# Patient Record
Sex: Male | Born: 1960 | Race: Black or African American | Hispanic: No | Marital: Single | Smoking: Never smoker
Health system: Southern US, Community
[De-identification: ages and names within clinical notes are randomized; demographics above are authoritative.]

## PROBLEM LIST (undated history)

## (undated) DIAGNOSIS — E119 Type 2 diabetes mellitus without complications: Secondary | ICD-10-CM

## (undated) DIAGNOSIS — D890 Polyclonal hypergammaglobulinemia: Secondary | ICD-10-CM

## (undated) DIAGNOSIS — I10 Essential (primary) hypertension: Secondary | ICD-10-CM

## (undated) DIAGNOSIS — F1010 Alcohol abuse, uncomplicated: Secondary | ICD-10-CM

## (undated) HISTORY — PX: OTHER SURGICAL HISTORY: SHX169

---

## 2003-04-01 ENCOUNTER — Encounter: Admission: RE | Admit: 2003-04-01 | Discharge: 2003-04-01 | Payer: Self-pay | Admitting: Neurology

## 2003-04-01 ENCOUNTER — Encounter: Payer: Self-pay | Admitting: Neurology

## 2004-01-25 ENCOUNTER — Emergency Department (HOSPITAL_COMMUNITY): Admission: EM | Admit: 2004-01-25 | Discharge: 2004-01-25 | Payer: Self-pay | Admitting: *Deleted

## 2004-08-27 ENCOUNTER — Emergency Department (HOSPITAL_COMMUNITY): Admission: EM | Admit: 2004-08-27 | Discharge: 2004-08-27 | Payer: Self-pay | Admitting: *Deleted

## 2004-09-04 ENCOUNTER — Emergency Department (HOSPITAL_COMMUNITY): Admission: EM | Admit: 2004-09-04 | Discharge: 2004-09-04 | Payer: Self-pay | Admitting: Emergency Medicine

## 2012-02-11 ENCOUNTER — Encounter (HOSPITAL_COMMUNITY): Payer: Self-pay | Admitting: Emergency Medicine

## 2012-02-11 ENCOUNTER — Emergency Department (HOSPITAL_COMMUNITY)
Admission: EM | Admit: 2012-02-11 | Discharge: 2012-02-11 | Disposition: A | Discharge status: Home / Self Care | Payer: BC Managed Care – PPO | Attending: Emergency Medicine | Admitting: Emergency Medicine

## 2012-02-11 DIAGNOSIS — M109 Gout, unspecified: Secondary | ICD-10-CM | POA: Insufficient documentation

## 2012-02-11 DIAGNOSIS — M25579 Pain in unspecified ankle and joints of unspecified foot: Secondary | ICD-10-CM | POA: Insufficient documentation

## 2012-02-11 DIAGNOSIS — I10 Essential (primary) hypertension: Secondary | ICD-10-CM | POA: Insufficient documentation

## 2012-02-11 HISTORY — DX: Essential (primary) hypertension: I10

## 2012-02-11 MED ORDER — INDOMETHACIN 50 MG PO CAPS: 50.0000 mg | ORAL_CAPSULE | Freq: Three times a day (TID) | ORAL | Status: AC

## 2012-02-11 NOTE — Discharge Instructions (Signed)
Arthritis - Gout Gout is caused by uric acid crystals forming in the joint. The big toe, foot, ankle, and knee are the joints most often affected.Often uric acid levels in the blood are also elevated. Symptoms develop rapidly, usually over hours. A joint fluid exam may be needed to prove the diagnosis. Acute gout episodes may follow a minor injury, surgery, illness or medication change. Gout occurs more often in men. It tends to be an inherited (passed down from a family member) condition. Your chance of having gout is increased if you take certain medications. These include water pills (diuretics), aspirin, niacin, and cyclosporine. Kidney disease and alcohol consumption may also increase your chances of getting gout. Months or years can pass between gout attacks.  Treatment includes ice, rest and raising the affected limb until the swelling and pain are better.Anti-inflammatory medicine usually brings about dramatic relief of pain, redness and swelling within 2 to 3 days. Other medications can also be effective. Increase your fluid intake. Do not drink alcohol or eat liver, sweetbreads, or sardines. Long-term gout management may require medicine to lower blood uric acid levels, or changing or stopping diuretics. Please see your caregiver if your condition is not better after 1 to 2 days of treatment.  SEEK IMMEDIATE MEDICAL CARE IF:  You have more serious symptoms such as a fever, skin rash, diarrhea, vomiting, or other joint pains. Document Released: 12/15/2004 Document Revised: 10/27/2011 Document Reviewed: 12/29/2008 [employer] Patient Information 2012 [employer]. 

## 2012-02-11 NOTE — ED Notes (Signed)
Pt c/o rt ankle pain since the 21st and has been out of work for the same and needs a work note.

## 2012-02-11 NOTE — ED Notes (Signed)
Pt refers to his pain as a questionable gout flare-up. Pt denies injury at this time.

## 2012-02-14 NOTE — ED Provider Notes (Signed)
History     CSN: [id number]  Arrival date & time 02/11/12  1144   First MD Initiated Contact with Patient 02/11/12 1210      Chief Complaint  Patient presents with  . Ankle Pain    (Consider location/radiation/quality/duration/timing/severity/associated sxs/prior treatment) Patient is a 51 y.o. male presenting with ankle pain. The history is provided by the patient. No language interpreter was used.  Ankle Pain  The incident occurred 2 days ago. The incident occurred at home. There was no injury mechanism. The pain is present in the left ankle. The quality of the pain is described as aching and burning. The pain is moderate. The pain has been constant since onset. Pertinent negatives include no numbness, no inability to bear weight, no loss of motion, no muscle weakness, no loss of sensation and no tingling. He reports no foreign bodies present. The symptoms are aggravated by activity, bearing weight and palpation. He has tried rest for the symptoms. The treatment provided no relief.    Past Medical History  Diagnosis Date  . Hypertension   . Gout     History reviewed. No pertinent past surgical history.  History reviewed. No pertinent family history.  History  Substance Use Topics  . Smoking status: Not on file  . Smokeless tobacco: Not on file  . Alcohol Use: Yes      Review of Systems  Musculoskeletal: Positive for arthralgias. Negative for joint swelling.  Skin: Negative for rash and wound.  Neurological: Negative for tingling and numbness.  Hematological: Does not bruise/bleed easily.  All other systems reviewed and are negative.    Allergies  Review of patient's allergies indicates no known allergies.  Home Medications   Current Outpatient Rx  Name Route Sig Dispense Refill  . INDOMETHACIN 50 MG PO CAPS Oral Take 1 capsule (50 mg total) by mouth 3 (three) times daily with meals. Until pain is improved 21 capsule 0    BP 188/105  Temp(Src) 97.9 F (36.6  C) (Oral)  Resp 20  Ht 5' 9.5" (1.765 m)  Wt 210 lb (95.255 kg)  BMI 30.57 kg/m2  SpO2 100%  Physical Exam  Nursing note and vitals reviewed. Constitutional: He is oriented to person, place, and time. He appears well-developed and well-nourished. No distress.  HENT:  Head: Normocephalic and atraumatic.  Neck: Neck supple.  Cardiovascular: Normal rate, regular rhythm and normal heart sounds.   Pulmonary/Chest: Effort normal and breath sounds normal. No respiratory distress.  Musculoskeletal: Normal range of motion. He exhibits tenderness. He exhibits no edema.       Right ankle: He exhibits normal range of motion, no swelling, no ecchymosis, no deformity, no laceration and normal pulse. tenderness. Lateral malleolus and medial malleolus tenderness found. No head of 5th metatarsal and no proximal fibula tenderness found. Achilles tendon normal.       Feet:  Neurological: He is alert and oriented to person, place, and time. No cranial nerve deficit. He exhibits normal muscle tone. Coordination normal.  Skin: Skin is warm and dry.    ED Course  Procedures (including critical care time)    1. Ankle pain   2. Gout       MDM     Patient with ttp of the right ankle and has hx of gout.  States pain feels similar to previous gout attacks.  DP puls is brisk.  Sensation intact, no open wounds to the ankle or foot.  No excessive erythema.  I doubt cellulitis.  No  calf pain or swelling.  I will treat with indocin .  He agrees to return here if the sx's worsen.  I will also refer to ortho.  Pt also requests a note for work.  Patient / Family / Caregiver understand and agree with initial ED impression and plan with expectations set for ED visit. Pt stable in ED with no significant deterioration in condition. Pt feels improved after observation and/or treatment in ED.      Strummer Canipe L. [place], [place] 02/14/12 2136

## 2012-02-15 NOTE — ED Provider Notes (Signed)
Medical screening examination/treatment/procedure(s) were performed by non-physician practitioner and as supervising physician I was immediately available for consultation/collaboration.   Aleyssa Pike W Auset Fritzler, MD 02/15/12 0729 

## 2017-07-16 ENCOUNTER — Emergency Department (HOSPITAL_COMMUNITY): Payer: No Typology Code available for payment source

## 2017-07-16 ENCOUNTER — Encounter (HOSPITAL_COMMUNITY): Payer: Self-pay

## 2017-07-16 ENCOUNTER — Inpatient Hospital Stay (HOSPITAL_COMMUNITY)
Admission: EM | Admit: 2017-07-16 | Discharge: 2017-08-04 | DRG: 432 | Disposition: A | Discharge status: Hospice | Payer: No Typology Code available for payment source | Attending: Internal Medicine | Admitting: Internal Medicine

## 2017-07-16 DIAGNOSIS — J90 Pleural effusion, not elsewhere classified: Secondary | ICD-10-CM | POA: Diagnosis not present

## 2017-07-16 DIAGNOSIS — D631 Anemia in chronic kidney disease: Secondary | ICD-10-CM | POA: Diagnosis present

## 2017-07-16 DIAGNOSIS — Z6828 Body mass index (BMI) 28.0-28.9, adult: Secondary | ICD-10-CM

## 2017-07-16 DIAGNOSIS — R188 Other ascites: Secondary | ICD-10-CM

## 2017-07-16 DIAGNOSIS — E8809 Other disorders of plasma-protein metabolism, not elsewhere classified: Secondary | ICD-10-CM | POA: Diagnosis present

## 2017-07-16 DIAGNOSIS — K7031 Alcoholic cirrhosis of liver with ascites: Principal | ICD-10-CM | POA: Diagnosis present

## 2017-07-16 DIAGNOSIS — Z515 Encounter for palliative care: Secondary | ICD-10-CM

## 2017-07-16 DIAGNOSIS — Z7189 Other specified counseling: Secondary | ICD-10-CM | POA: Diagnosis not present

## 2017-07-16 DIAGNOSIS — E43 Unspecified severe protein-calorie malnutrition: Secondary | ICD-10-CM | POA: Diagnosis not present

## 2017-07-16 DIAGNOSIS — K5641 Fecal impaction: Secondary | ICD-10-CM | POA: Diagnosis not present

## 2017-07-16 DIAGNOSIS — E875 Hyperkalemia: Secondary | ICD-10-CM | POA: Diagnosis not present

## 2017-07-16 DIAGNOSIS — M109 Gout, unspecified: Secondary | ICD-10-CM | POA: Diagnosis present

## 2017-07-16 DIAGNOSIS — N170 Acute kidney failure with tubular necrosis: Secondary | ICD-10-CM | POA: Diagnosis present

## 2017-07-16 DIAGNOSIS — E1122 Type 2 diabetes mellitus with diabetic chronic kidney disease: Secondary | ICD-10-CM | POA: Diagnosis present

## 2017-07-16 DIAGNOSIS — I129 Hypertensive chronic kidney disease with stage 1 through stage 4 chronic kidney disease, or unspecified chronic kidney disease: Secondary | ICD-10-CM | POA: Diagnosis present

## 2017-07-16 DIAGNOSIS — R140 Abdominal distension (gaseous): Secondary | ICD-10-CM | POA: Diagnosis not present

## 2017-07-16 DIAGNOSIS — R64 Cachexia: Secondary | ICD-10-CM | POA: Diagnosis present

## 2017-07-16 DIAGNOSIS — N179 Acute kidney failure, unspecified: Secondary | ICD-10-CM | POA: Diagnosis not present

## 2017-07-16 DIAGNOSIS — D638 Anemia in other chronic diseases classified elsewhere: Secondary | ICD-10-CM | POA: Diagnosis present

## 2017-07-16 DIAGNOSIS — F1011 Alcohol abuse, in remission: Secondary | ICD-10-CM | POA: Diagnosis present

## 2017-07-16 DIAGNOSIS — K652 Spontaneous bacterial peritonitis: Secondary | ICD-10-CM | POA: Diagnosis present

## 2017-07-16 DIAGNOSIS — N189 Chronic kidney disease, unspecified: Secondary | ICD-10-CM | POA: Diagnosis present

## 2017-07-16 DIAGNOSIS — R296 Repeated falls: Secondary | ICD-10-CM | POA: Diagnosis present

## 2017-07-16 DIAGNOSIS — Z9181 History of falling: Secondary | ICD-10-CM

## 2017-07-16 DIAGNOSIS — D696 Thrombocytopenia, unspecified: Secondary | ICD-10-CM | POA: Diagnosis present

## 2017-07-16 DIAGNOSIS — R0602 Shortness of breath: Secondary | ICD-10-CM

## 2017-07-16 DIAGNOSIS — K429 Umbilical hernia without obstruction or gangrene: Secondary | ICD-10-CM | POA: Diagnosis present

## 2017-07-16 DIAGNOSIS — K7460 Unspecified cirrhosis of liver: Secondary | ICD-10-CM | POA: Diagnosis not present

## 2017-07-16 DIAGNOSIS — E872 Acidosis: Secondary | ICD-10-CM | POA: Diagnosis present

## 2017-07-16 DIAGNOSIS — E871 Hypo-osmolality and hyponatremia: Secondary | ICD-10-CM | POA: Diagnosis present

## 2017-07-16 DIAGNOSIS — Z66 Do not resuscitate: Secondary | ICD-10-CM | POA: Diagnosis present

## 2017-07-16 DIAGNOSIS — F17290 Nicotine dependence, other tobacco product, uncomplicated: Secondary | ICD-10-CM | POA: Diagnosis present

## 2017-07-16 DIAGNOSIS — E8770 Fluid overload, unspecified: Secondary | ICD-10-CM | POA: Diagnosis present

## 2017-07-16 DIAGNOSIS — Z87898 Personal history of other specified conditions: Secondary | ICD-10-CM | POA: Diagnosis not present

## 2017-07-16 DIAGNOSIS — D890 Polyclonal hypergammaglobulinemia: Secondary | ICD-10-CM | POA: Diagnosis present

## 2017-07-16 HISTORY — DX: Polyclonal hypergammaglobulinemia: D89.0

## 2017-07-16 HISTORY — DX: Alcohol abuse, uncomplicated: F10.10

## 2017-07-16 HISTORY — DX: Type 2 diabetes mellitus without complications: E11.9

## 2017-07-16 LAB — COMPREHENSIVE METABOLIC PANEL
ALT: 11 U/L — ABNORMAL LOW (ref 17–63)
AST: 29 U/L (ref 15–41)
Albumin: 1.8 g/dL — ABNORMAL LOW (ref 3.5–5.0)
Alkaline Phosphatase: 51 U/L (ref 38–126)
Anion gap: 10 (ref 5–15)
BUN: 30 mg/dL — ABNORMAL HIGH (ref 6–20)
CO2: 18 mmol/L — ABNORMAL LOW (ref 22–32)
Calcium: 8.4 mg/dL — ABNORMAL LOW (ref 8.9–10.3)
Chloride: 101 mmol/L (ref 101–111)
Creatinine, Ser: 2.39 mg/dL — ABNORMAL HIGH (ref 0.61–1.24)
GFR calc Af Amer: 33 mL/min — ABNORMAL LOW (ref >60)
GFR calc non Af Amer: 29 mL/min — ABNORMAL LOW (ref >60)
Glucose, Bld: 136 mg/dL — ABNORMAL HIGH (ref 65–99)
Potassium: 4.2 mmol/L (ref 3.5–5.1)
Sodium: 129 mmol/L — ABNORMAL LOW (ref 135–145)
Total Bilirubin: 2.9 mg/dL — ABNORMAL HIGH (ref 0.3–1.2)
Total Protein: 7.2 g/dL (ref 6.5–8.1)

## 2017-07-16 LAB — LACTIC ACID, PLASMA
Lactic Acid, Venous: 3.4 mmol/L (ref 0.5–1.9)
Lactic Acid, Venous: 3.3 mmol/L (ref 0.5–1.9)

## 2017-07-16 LAB — CBC WITH DIFFERENTIAL/PLATELET
Basophils Absolute: 0 10*3/uL (ref 0.0–0.1)
Basophils Relative: 0 %
Eosinophils Absolute: 0 10*3/uL (ref 0.0–0.7)
Eosinophils Relative: 0 %
HCT: 29.4 % — ABNORMAL LOW (ref 39.0–52.0)
Hemoglobin: 10.2 g/dL — ABNORMAL LOW (ref 13.0–17.0)
Lymphocytes Relative: 9 %
Lymphs Abs: 0.7 10*3/uL (ref 0.7–4.0)
MCH: 34.1 pg — ABNORMAL HIGH (ref 26.0–34.0)
MCHC: 34.7 g/dL (ref 30.0–36.0)
MCV: 98.3 fL (ref 78.0–100.0)
Monocytes Absolute: 1 10*3/uL (ref 0.1–1.0)
Monocytes Relative: 12 %
Neutro Abs: 6.5 10*3/uL (ref 1.7–7.7)
Neutrophils Relative %: 78 %
Platelets: 103 10*3/uL — ABNORMAL LOW (ref 150–400)
RBC: 2.99 MIL/uL — ABNORMAL LOW (ref 4.22–5.81)
RDW: 15.1 % (ref 11.5–15.5)
WBC: 8.3 10*3/uL (ref 4.0–10.5)

## 2017-07-16 LAB — ETHANOL: Alcohol, Ethyl (B): <5 mg/dL (ref <5)

## 2017-07-16 LAB — TROPONIN I: Troponin I: <0.03 ng/mL (ref <0.03)

## 2017-07-16 LAB — PROTIME-INR
INR: 1.97
Prothrombin Time: 22.8 seconds — ABNORMAL HIGH (ref 11.4–15.2)

## 2017-07-16 LAB — BRAIN NATRIURETIC PEPTIDE: B Natriuretic Peptide: 59 pg/mL (ref 0.0–100.0)

## 2017-07-16 MED ORDER — SODIUM CHLORIDE 0.9 % IV SOLN
INTRAVENOUS | Status: DC
  Administered 2017-07-16: via INTRAVENOUS

## 2017-07-16 MED ORDER — SODIUM CHLORIDE 0.9 % IV BOLUS (SEPSIS)
250.0000 mL | Freq: Once | INTRAVENOUS | Status: AC
  Administered 2017-07-16: 250 mL via INTRAVENOUS

## 2017-07-16 MED ORDER — ONDANSETRON HCL 4 MG PO TABS: 4.0000 mg | ORAL_TABLET | Freq: Four times a day (QID) | ORAL | Status: DC | PRN

## 2017-07-16 MED ORDER — ALBUMIN HUMAN 25 % IV SOLN
12.5000 g | Freq: Once | INTRAVENOUS | Status: AC
  Administered 2017-07-17: 12.5 g via INTRAVENOUS
  Filled 2017-07-16: qty 50

## 2017-07-16 MED ORDER — SODIUM CHLORIDE 0.9 % IV BOLUS (SEPSIS): 1000.0000 mL | Freq: Once | INTRAVENOUS | Status: DC

## 2017-07-16 MED ORDER — DEXTROSE 5 % IV SOLN
2.0000 g | INTRAVENOUS | Status: DC
  Administered 2017-07-16 – 2017-07-23 (×8): 2 g via INTRAVENOUS
  Filled 2017-07-16 (×10): qty 2

## 2017-07-16 MED ORDER — INSULIN ASPART 100 UNIT/ML ~~LOC~~ SOLN
0.0000 [IU] | Freq: Three times a day (TID) | SUBCUTANEOUS | Status: DC
  Administered 2017-07-18 – 2017-07-22 (×7): 1 [IU] via SUBCUTANEOUS
  Administered 2017-07-22: 2 [IU] via SUBCUTANEOUS
  Administered 2017-07-23: 0 [IU] via SUBCUTANEOUS
  Administered 2017-07-23: 2 [IU] via SUBCUTANEOUS
  Administered 2017-07-24: 1 [IU] via SUBCUTANEOUS
  Administered 2017-07-24: 2 [IU] via SUBCUTANEOUS
  Administered 2017-07-25 (×3): 1 [IU] via SUBCUTANEOUS
  Administered 2017-07-26: 2 [IU] via SUBCUTANEOUS
  Administered 2017-07-27 – 2017-07-30 (×3): 1 [IU] via SUBCUTANEOUS
  Administered 2017-08-01: 2 [IU] via SUBCUTANEOUS
  Administered 2017-08-01: 1 [IU] via SUBCUTANEOUS
  Administered 2017-08-02: 2 [IU] via SUBCUTANEOUS
  Administered 2017-08-02: 1 [IU] via SUBCUTANEOUS
  Administered 2017-08-02 – 2017-08-03 (×2): 2 [IU] via SUBCUTANEOUS

## 2017-07-16 MED ORDER — SODIUM CHLORIDE 0.9 % IV SOLN
INTRAVENOUS | Status: DC
  Administered 2017-07-16: 17:00:00 via INTRAVENOUS

## 2017-07-16 MED ORDER — SODIUM CHLORIDE 0.9 % IV BOLUS (SEPSIS)
500.0000 mL | Freq: Once | INTRAVENOUS | Status: AC
  Administered 2017-07-16: 500 mL via INTRAVENOUS

## 2017-07-16 MED ORDER — ONDANSETRON HCL 4 MG/2ML IJ SOLN: 4.0000 mg | Freq: Four times a day (QID) | INTRAMUSCULAR | Status: DC | PRN

## 2017-07-16 NOTE — ED Notes (Signed)
CRITICAL VALUE ALERT  Critical Value:  Lactic Acid 3.4  Date & Time Notied:  07/16/17 1704  Provider Notified: B. Garner Nash, RN  Orders Received/Actions taken: notified Dr. Clarene Duke

## 2017-07-16 NOTE — Progress Notes (Signed)
ANTIBIOTIC CONSULT NOTE-Preliminary  Pharmacy Consult for Ceftriaxone Indication: Spontaneous bacterial peritonitis  No Known Allergies  Patient Measurements: Height: 5\' 9"  (175.3 cm) Weight: 216 lb 8 oz (98.2 kg) IBW/kg (Calculated) : 70.7   Vital Signs: Temp: 97.5 F (36.4 C) (08/26 2112) Temp Source: Oral (08/26 2112) BP: 102/72 (08/26 2112) Pulse Rate: 103 (08/26 2112)  Labs:  Recent Labs  07/16/17 1555  WBC 8.3  HGB 10.2*  PLT 103*  CREATININE 2.39*    Estimated Creatinine Clearance: 39.9 mL/min (A) (by C-G formula based on SCr of 2.39 mg/dL (H)).  No results for input(s): VANCOTROUGH, VANCOPEAK, VANCORANDOM, GENTTROUGH, GENTPEAK, GENTRANDOM, TOBRATROUGH, TOBRAPEAK, TOBRARND, AMIKACINPEAK, AMIKACINTROU, AMIKACIN in the last 72 hours.   Microbiology: No results found for this or any previous visit (from the past 720 hour(s)).  Medical History: Past Medical History:  Diagnosis Date  . Alcohol abuse   . Diabetes mellitus without complication (HCC)   . Gout   . Hypertension     Medications:   Assessment: 56 yo male with hx of alcohol abuse seen in the ED for complaints of weakness and abdominal distension. CT showed cirrhosis and large abdominopelvic ascites. Pt also has pleural effusions. Pneumonia not excluded. Pharmacy has been consulted for ceftriaxone dosing.  Goal of Therapy:  Eradicated infection  Plan:  Preliminary review of pertinent patient information completed.  Protocol will be initiated with one dose of ceftriaxone 2 Gm IV.  Jeani Hawking clinical pharmacist will complete review during morning rounds to assess patient and finalize treatment regimen if needed.  Arelia Sneddon, [facility] 07/16/2017,10:47 PM

## 2017-07-16 NOTE — ED Provider Notes (Signed)
[facility]-EMERGENCY DEPT Provider Note   CSN: [id number] Arrival date & time: 07/16/17  1520     History   Chief Complaint Chief Complaint  Patient presents with  . abdominal distension  . Weakness    HPI Shawn [name] is a 56 y.o. male.  HPI  Pt was seen at 1540. Per pt and his friend: c/o gradual onset and worsening of persistent generalized weakness and abd distention for the past 8 months. Has been associated with bilat LE's edema. Pt endorses hx of heavy etoh use, but states he "quit" 4 months ago. Pt's friend states they finally convinced him to come to the hospital today because he "has been falling a lot," "not taking care of his diabetes," "not eating,' and "has no strength." Pt lives alone. Denies CP/palpitations, no SOB/cough, no abd pain, no N/V/D, no fevers, no focal motor weakness, no tingling/numbness in extremities.     Past Medical History:  Diagnosis Date  . Alcohol abuse   . Diabetes mellitus without complication (HCC)   . Gout   . Hypertension     There are no active problems to display for this patient.   History reviewed. No pertinent surgical history.     Home Medications    Prior to Admission medications   Not on File    Family History No family history on file.  Social History Social History  Substance Use Topics  . Smoking status: Never Smoker  . Smokeless tobacco: Current User  . Alcohol use Yes     Comment: stopped drinking 3 months ago, but normally 6 pack per pay     Allergies   Patient has no known allergies.   Review of Systems Review of Systems ROS: Statement: All systems negative except as marked or noted in the HPI; Constitutional: Negative for fever and chills. +generalized weakness.; ; Eyes: Negative for eye pain, redness and discharge. ; ; ENMT: Negative for ear pain, hoarseness, nasal congestion, sinus pressure and sore throat. ; ; Cardiovascular: Negative for chest pain, palpitations, diaphoresis, dyspnea and  +peripheral edema. ; ; Respiratory: Negative for cough, wheezing and stridor. ; ; Gastrointestinal: +abd distention, poor PO intake. Negative for nausea, vomiting, diarrhea, abdominal pain, blood in stool, hematemesis, jaundice and rectal bleeding. . ; ; Genitourinary: Negative for dysuria, flank pain and hematuria. ; ; Musculoskeletal: Negative for back pain and neck pain. Negative for swelling and trauma.; ; Skin: Negative for pruritus, rash, abrasions, blisters, bruising and skin lesion.; ; Neuro: Negative for headache, lightheadedness and neck stiffness. Negative for altered level of consciousness, altered mental status, extremity weakness, paresthesias, involuntary movement, seizure and syncope.       Physical Exam Updated Vital Signs BP 105/77   Pulse (!) 106   Temp 98.7 F (37.1 C) (Oral)   Resp (!) 0   Ht 5\' 9"  (1.753 m)   SpO2 95%    Patient Vitals for the past 24 hrs:  BP Temp Temp src Pulse Resp SpO2 Height  07/16/17 1800 99/72 - - (!) 105 19 97 % -  07/16/17 1730 115/82 - - (!) 106 14 93 % -  07/16/17 1700 104/71 - - (!) 106 (!) 30 94 % -  07/16/17 1630 105/77 - - (!) 106 (!) 0 95 % -  07/16/17 1600 106/80 - - (!) 107 (!) 21 95 % -  07/16/17 1545 - - - (!) 110 - 98 % -  07/16/17 1538 114/81 98.7 F (37.1 C) Oral (!) 111 (!) 30  97 % -  07/16/17 1534 - - - - - - 5\' 9"  (1.753 m)      Physical Exam 1545: Physical examination:  Nursing notes reviewed; Vital signs and O2 SAT reviewed;  Constitutional: Well developed, Well nourished, Well hydrated, In no acute distress; Head:  Normocephalic, atraumatic; Eyes: EOMI, PERRL, No conjunctival injection; ENMT: Mouth and pharynx normal, Mucous membranes moist; Neck: Supple, Full range of motion, No lymphadenopathy; Cardiovascular: Tachycardic rate and rhythm, No gallop; Respiratory: Breath sounds clear & equal bilaterally, No wheezes. Speaking full sentences with ease, Normal respiratory effort/excursion; Chest: Nontender, Movement  normal; Abdomen:  Nontender, +softly distended, +fluid wave. Decreased bowel sounds; Genitourinary: No CVA tenderness; Extremities: Pulses normal, No tenderness, +3 pedal edema bilat feet to thighs.  No calf asymmetry.; Neuro: AA&Ox3, Major CN grossly intact.  Speech clear. No gross focal motor deficits in extremities.; Skin: Color sallow, Warm, Dry.   ED Treatments / Results  Labs (all labs ordered are listed, but only abnormal results are displayed)   EKG  EKG Interpretation  Date/Time:  Sunday July 16 2017 16:09:16 EDT Ventricular Rate:  106 PR Interval:    QRS Duration: 85 QT Interval:  318 QTC Calculation: 423 R Axis:   100 Text Interpretation:  Sinus tachycardia Anterior infarct, old Baseline wander No old tracing to compare Confirmed by Samuel Jester [phone]) on 07/16/2017 4:42:18 PM       Radiology   Procedures Procedures (including critical care time)  Medications Ordered in ED Medications - No data to display   Initial Impression / Assessment and Plan / ED Course  I have reviewed the triage vital signs and the nursing notes.  Pertinent labs & imaging results that were available during my care of the patient were reviewed by me and considered in my medical decision making (see chart for details).  MDM Reviewed: previous chart, nursing note and vitals Interpretation: labs, ECG, x-ray and CT scan Total time providing critical care: 30-74 minutes. This excludes time spent performing separately reportable procedures and services. Consults: admitting MD   CRITICAL CARE Performed by: Laray Anger Total critical care time: 35 minutes Critical care time was exclusive of separately billable procedures and treating other patients. Critical care was necessary to treat or prevent imminent or life-threatening deterioration. Critical care was time spent personally by me on the following activities: development of treatment plan with patient and/or surrogate as  well as nursing, discussions with consultants, evaluation of patient's response to treatment, examination of patient, obtaining history from patient or surrogate, ordering and performing treatments and interventions, ordering and review of laboratory studies, ordering and review of radiographic studies, pulse oximetry and re-evaluation of patient's condition.   Results for orders placed or performed during the hospital encounter of 07/16/17  Comprehensive metabolic panel  Result Value Ref Range   Sodium 129 (L) 135 - 145 mmol/L   Potassium 4.2 3.5 - 5.1 mmol/L   Chloride 101 101 - 111 mmol/L   CO2 18 (L) 22 - 32 mmol/L   Glucose, Bld 136 (H) 65 - 99 mg/dL   BUN 30 (H) 6 - 20 mg/dL   Creatinine, Ser 6.04 (H) 0.61 - 1.24 mg/dL   Calcium 8.4 (L) 8.9 - 10.3 mg/dL   Total Protein 7.2 6.5 - 8.1 g/dL   Albumin 1.8 (L) 3.5 - 5.0 g/dL   AST 29 15 - 41 U/L   ALT 11 (L) 17 - 63 U/L   Alkaline Phosphatase 51 38 - 126 U/L   Total  Bilirubin 2.9 (H) 0.3 - 1.2 mg/dL   GFR calc non Af Amer 29 (L) >60 mL/min   GFR calc Af Amer 33 (L) >60 mL/min   Anion gap 10 5 - 15  Ethanol  Result Value Ref Range   Alcohol, Ethyl (B) <5 <5 mg/dL  Troponin I  Result Value Ref Range   Troponin I <0.03 <0.03 ng/mL  Lactic acid, plasma  Result Value Ref Range   Lactic Acid, Venous 3.4 (HH) 0.5 - 1.9 mmol/L  Lactic acid, plasma  Result Value Ref Range   Lactic Acid, Venous 3.3 (HH) 0.5 - 1.9 mmol/L  Brain natriuretic peptide  Result Value Ref Range   B Natriuretic Peptide 59.0 0.0 - 100.0 pg/mL  CBC with Differential  Result Value Ref Range   WBC 8.3 4.0 - 10.5 K/uL   RBC 2.99 (L) 4.22 - 5.81 MIL/uL   Hemoglobin 10.2 (L) 13.0 - 17.0 g/dL   HCT 16.1 (L) 09.6 - 04.5 %   MCV 98.3 78.0 - 100.0 fL   MCH 34.1 (H) 26.0 - 34.0 pg   MCHC 34.7 30.0 - 36.0 g/dL   RDW 40.9 81.1 - 91.4 %   Platelets 103 (L) 150 - 400 K/uL   Neutrophils Relative % 78 %   Neutro Abs 6.5 1.7 - 7.7 K/uL   Lymphocytes Relative 9 %    Lymphs Abs 0.7 0.7 - 4.0 K/uL   Monocytes Relative 12 %   Monocytes Absolute 1.0 0.1 - 1.0 K/uL   Eosinophils Relative 0 %   Eosinophils Absolute 0.0 0.0 - 0.7 K/uL   Basophils Relative 0 %   Basophils Absolute 0.0 0.0 - 0.1 K/uL  Protime-INR  Result Value Ref Range   Prothrombin Time 22.8 (H) 11.4 - 15.2 seconds   INR 1.97    Ct Abdomen Pelvis Wo Contrast Result Date: 07/16/2017 CLINICAL DATA:  Abdominal distention, weakness.  Heavy drinker. EXAM: CT ABDOMEN AND PELVIS WITHOUT CONTRAST TECHNIQUE: Multidetector CT imaging of the abdomen and pelvis was performed following the standard protocol without IV contrast. COMPARISON:  None. FINDINGS: Lower chest: Moderate left and small right pleural effusion. Patchy right lower lobe opacity, likely atelectasis, pneumonia not excluded. Patchy left lower lobe opacity, likely atelectasis. Hepatobiliary: Shrunken, nodular liver, reflecting a cirrhotic configuration. No focal hepatic lesion is seen. Gallbladder is contracted with multiple small gallstones (series 2/image 28). Pancreas: Within normal limits. Spleen: Normal in size. Adrenals/Urinary Tract: Adrenal glands are within normal limits. Kidneys are within normal limits.  No renal or ureteral calculi. Bladder is not discretely visualized. Stomach/Bowel: Stomach is within normal limits. Visualized bowel is grossly unremarkable. No evidence of bowel obstruction. Vascular/Lymphatic: No evidence of abdominal aortic aneurysm. Mild atherosclerotic calcifications. No gross abdominopelvic lymphadenopathy, although poorly evaluated. Reproductive: Prostate is unremarkable. Other: Large volume abdominopelvic ascites. Musculoskeletal: Mild degenerative changes of the visualized thoracolumbar spine, most prominent at L4-5. IMPRESSION: Cirrhosis.  No focal hepatic lesion is seen. Large volume abdominopelvic ascites. Moderate left and small right pleural effusions. Patchy bilateral lower lobe opacities, likely atelectasis,  right lower lobe pneumonia not excluded. Electronically Signed   By: Charline Bills M.D.   On: 07/16/2017 18:17   Dg Chest 2 View Result Date: 07/16/2017 CLINICAL DATA:  Cough and weakness EXAM: CHEST  2 VIEW COMPARISON:  02/05/2015 FINDINGS: Cardiac shadow is stable. The overall inspiratory effort is poor with bibasilar atelectatic changes. Left pleural effusion is noted similar to that seen on recent chest CT. No other focal abnormality is noted.  IMPRESSION: Bibasilar atelectasis and left-sided pleural effusion. Electronically Signed   By: Alcide Clever M.D.   On: 07/16/2017 18:15    1915:  The patient is noted to have a lactate>3. With the current information available to me, I don't think the patient is in septic shock. The lactate>3, is related to liver failure .  Doubt pneumonia on CT scan given normal WBC count and pt afebrile. Given pt's significant ascites and bilateral pleural effusions, will now dose judicious IVF.  Dx and testing d/w pt and family.  Questions answered.  Verb understanding, agreeable to admit. T/C to [facility] Dr. Sharl Ma, case discussed, including:  HPI, pertinent PM/SHx, VS/PE, dx testing, ED course and treatment:  Agreeable to admit.    Final Clinical Impressions(s) / ED Diagnoses   Final diagnoses:  None    New Prescriptions New Prescriptions   No medications on file     Samuel Jester, DO 07/20/17 1642

## 2017-07-16 NOTE — ED Triage Notes (Addendum)
Pt brought in by family due to abdominal distension, weakness, unable to eat. Pt has recently fallen. Pt reports he has not taken care of his diabetes. Reports he has been a heavy drinker. Edema noted in lower extremties and no strength in legs. Pt face and arms are emaciated

## 2017-07-16 NOTE — ED Notes (Signed)
Pt back in room from radiology. While in radiology, pt 02 saturation decreased. Went over to assess patient. Pt slumped down in stretcher and finger sensor halfway off. Pt repositioned and room air saturation 88%. Pt placed on 2 liters nasal cannula, saturation increased to 97%. Pt tolerated repositioning and oxygen well. Primary RN aware.

## 2017-07-16 NOTE — H&P (Addendum)
TRH H&P    Patient Demographics:    Shawn [name], is a 56 y.o. male  MRN: [id number]  DOB - Nov 29, 1960  Admit Date - 07/16/2017  Referring MD/NP/PA: Dr. Clarene Duke  Outpatient Primary MD for the patient is Patient, No Pcp Per  Patient coming from: Home  Chief Complaint  Patient presents with  . abdominal distension  . Weakness      HPI:    Shawn [name]  is a 56 y.o. male, With history of alcohol abuse, hypertension who came to hospital with complaints of generalized weakness and abdominal distention for past 3 months. Patient says that he quit drinking 4 months ago but he has been drinking almost all his life. Patient says he does not visit doctors and does not take any medications. He does have diabetes but has been taking care of diabetes by diet alone. He denies nausea vomiting or diarrhea. No fever. No chills. He complains of shortness of breath. No chest pain.  In the ED, CT scan of the abdomen and pelvis was done which showed cirrhosis and no focal hepatic lesion, large volume abdominopelvic ascites. Moderate left and small right pleural effusions. Patchy bilateral lower lobe opacities likely atelectasis, right lower lobe pneumonia not excluded. Lactic acid was 3.4, BUN 30, and 2.39, sodium 129.    Review of systems:    In addition to the HPI above,  No Fever-chills, No Headache, No changes with Vision or hearing, + Anorexia No Chest pain, Cough or Shortness of Breath, No Abdominal pain, No Nausea or Vomiting, bowel movements are regular, No Blood in stool or Urine, No dysuria, No new skin rashes or bruises, + Pain in the knees  No new weakness, tingling, numbness in any extremity,    All other systems reviewed and are negative.   With Past History of the following :    Past Medical History:  Diagnosis Date  . Alcohol abuse   . Diabetes mellitus without complication (HCC)   . Gout   .  Hypertension       History reviewed. No pertinent surgical history.    Social History:      Social History  Substance Use Topics  . Smoking status: Never Smoker  . Smokeless tobacco: Current User  . Alcohol use Yes     Comment: stopped drinking 3 months ago, but normally 6 pack per pay       Family History :    Patient's 1 sister had breast cancer   Home Medications:   Prior to Admission medications   Not on File     Allergies:    No Known Allergies   Physical Exam:   Vitals  Blood pressure 99/72, pulse (!) 105, temperature 98.7 F (37.1 C), temperature source Oral, resp. rate 19, height 5\' 9"  (1.753 m), SpO2 97 %.  1.  General: Appears lethargic  2. Psychiatric:  Intact judgement and  insight, awake alert, oriented x 3.  3. Neurologic: No focal neurological deficits, all cranial nerves intact.Strength 5/5 all 4 extremities, sensation intact all 4  extremities, plantars down going.  4. Eyes : icteric sclerae, moist conjunctivae with no lid lag.  5. ENMT:  Oropharynx clear with moist mucous membranes and good dentition  6. Neck:  supple, no cervical lymphadenopathy appriciated, No thyromegaly  7. Respiratory : Normal respiratory effort, good air movement bilaterally,clear to  auscultation bilaterally  8. Cardiovascular : RRR, no gallops, rubs or murmurs, bilateral 2+ pitting edema of the lower extremities  9. Gastrointestinal:  Marked distention of abdomen, nontender to palpation, liver not palpable  10. Skin:  No cyanosis, normal texture and turgor, no rash, lesions or ulcers      Data Review:    CBC  Recent Labs Lab 07/16/17 1555  WBC 8.3  HGB 10.2*  HCT 29.4*  PLT 103*  MCV 98.3  MCH 34.1*  MCHC 34.7  RDW 15.1  LYMPHSABS 0.7  MONOABS 1.0  EOSABS 0.0  BASOSABS 0.0   ------------------------------------------------------------------------------------------------------------------  Chemistries   Recent Labs Lab  07/16/17 1555  NA 129*  K 4.2  CL 101  CO2 18*  GLUCOSE 136*  BUN 30*  CREATININE 2.39*  CALCIUM 8.4*  AST 29  ALT 11*  ALKPHOS 51  BILITOT 2.9*   ------------------------------------------------------------------------------------------------------------------  ------------------------------------------------------------------------------------------------------------------ GFR: CrCl cannot be calculated (Unknown ideal weight.). Liver Function Tests:  Recent Labs Lab 07/16/17 1555  AST 29  ALT 11*  ALKPHOS 51  BILITOT 2.9*  PROT 7.2  ALBUMIN 1.8*   No results for input(s): LIPASE, AMYLASE in the last 168 hours. No results for input(s): AMMONIA in the last 168 hours. Coagulation Profile:  Recent Labs Lab 07/16/17 1555  INR 1.97   Cardiac Enzymes:  Recent Labs Lab 07/16/17 1555  TROPONINI <0.03   BNP (last 3 results) No results for input(s): PROBNP in the last 8760 hours. HbA1C: No results for input(s): HGBA1C in the last 72 hours. CBG: No results for input(s): GLUCAP in the last 168 hours. Lipid Profile: No results for input(s): CHOL, HDL, LDLCALC, TRIG, CHOLHDL, LDLDIRECT in the last 72 hours. Thyroid Function Tests: No results for input(s): TSH, T4TOTAL, FREET4, T3FREE, THYROIDAB in the last 72 hours. Anemia Panel: No results for input(s): VITAMINB12, FOLATE, FERRITIN, TIBC, IRON, RETICCTPCT in the last 72 hours.  --------------------------------------------------------------------------------------------------------------- Urine analysis: No results found for: COLORURINE, APPEARANCEUR, LABSPEC, PHURINE, GLUCOSEU, HGBUR, BILIRUBINUR, KETONESUR, PROTEINUR, UROBILINOGEN, NITRITE, LEUKOCYTESUR    Imaging Results:    Ct Abdomen Pelvis Wo Contrast  Result Date: 07/16/2017 CLINICAL DATA:  Abdominal distention, weakness.  Heavy drinker. EXAM: CT ABDOMEN AND PELVIS WITHOUT CONTRAST TECHNIQUE: Multidetector CT imaging of the abdomen and pelvis was  performed following the standard protocol without IV contrast. COMPARISON:  None. FINDINGS: Lower chest: Moderate left and small right pleural effusion. Patchy right lower lobe opacity, likely atelectasis, pneumonia not excluded. Patchy left lower lobe opacity, likely atelectasis. Hepatobiliary: Shrunken, nodular liver, reflecting a cirrhotic configuration. No focal hepatic lesion is seen. Gallbladder is contracted with multiple small gallstones (series 2/image 28). Pancreas: Within normal limits. Spleen: Normal in size. Adrenals/Urinary Tract: Adrenal glands are within normal limits. Kidneys are within normal limits.  No renal or ureteral calculi. Bladder is not discretely visualized. Stomach/Bowel: Stomach is within normal limits. Visualized bowel is grossly unremarkable. No evidence of bowel obstruction. Vascular/Lymphatic: No evidence of abdominal aortic aneurysm. Mild atherosclerotic calcifications. No gross abdominopelvic lymphadenopathy, although poorly evaluated. Reproductive: Prostate is unremarkable. Other: Large volume abdominopelvic ascites. Musculoskeletal: Mild degenerative changes of the visualized thoracolumbar spine, most prominent at L4-5. IMPRESSION: Cirrhosis.  No focal hepatic lesion  is seen. Large volume abdominopelvic ascites. Moderate left and small right pleural effusions. Patchy bilateral lower lobe opacities, likely atelectasis, right lower lobe pneumonia not excluded. Electronically Signed   By: Charline Bills M.D.   On: 07/16/2017 18:17   Dg Chest 2 View  Result Date: 07/16/2017 CLINICAL DATA:  Cough and weakness EXAM: CHEST  2 VIEW COMPARISON:  02/05/2015 FINDINGS: Cardiac shadow is stable. The overall inspiratory effort is poor with bibasilar atelectatic changes. Left pleural effusion is noted similar to that seen on recent chest CT. No other focal abnormality is noted. IMPRESSION: Bibasilar atelectasis and left-sided pleural effusion. Electronically Signed   By: Alcide Clever  M.D.   On: 07/16/2017 18:15    My personal review of EKG: Rhythm NSR   Assessment & Plan:    Active Problems:   Ascites due to alcoholic cirrhosis (HCC)   1. Massive ascites due to alcoholic cirrhosis- patient likely has alcoholic cirrhosis as he has long-standing history of alcohol abuse, and cirrhotic liver noted on CT scan. Will order ultrasound-guided paracentesis in a.m. We'll check ascitic fluid for glucose, cell count, culture, Gram stain, bilirubin, amylase, albumin. Will order 1 dose of 25% albumin 12.5 g IV 1 after paracentesis in a.m. 2. SBP prophylaxis- we'll start patient on ceftriaxone 1 g IV every 24 hours. 3. Mild lactic acidosis- likely from alcohol liver disease. Patient does not appear to be in sepsis. UA and urine culture has been ordered. Patient started on ceftriaxone. Follow urine culture results. Patient received IV fluid bolus in the ED, due to massive fluid overload will avoid giving any more IV fluids unless absolutely needed. At this time the patient is stable. Will keep KVO. 4. Diabetes mellitus-diet controlled as per patient, will order sliding scale insulin with NovoLog, check hemoglobin A1c in a.m. 5. Acute kidney injury- unknown baseline, today creatinine is 2.39. Follow BMP in a.m.   DVT Prophylaxis-    SCDs   AM Labs Ordered, also please review Full Orders  Family Communication: Admission, patients condition and plan of care including tests being ordered have been discussed with the patient  who indicate understanding and agree with the plan and Code Status.  Code Status:  Full code  Admission status: Inpatient    Time spent in minutes : 60 minutes   Susannah Carbin S M.D on 07/16/2017 at 8:11 PM  Between 7am to 7pm - Pager - [phone]. After 7pm go to [email or web address] - password [facility]  [facility] - Office  [phone]

## 2017-07-17 ENCOUNTER — Encounter (HOSPITAL_COMMUNITY): Payer: Self-pay | Admitting: *Deleted

## 2017-07-17 ENCOUNTER — Inpatient Hospital Stay (HOSPITAL_COMMUNITY): Payer: No Typology Code available for payment source

## 2017-07-17 DIAGNOSIS — E8809 Other disorders of plasma-protein metabolism, not elsewhere classified: Secondary | ICD-10-CM | POA: Diagnosis present

## 2017-07-17 DIAGNOSIS — N179 Acute kidney failure, unspecified: Secondary | ICD-10-CM | POA: Diagnosis present

## 2017-07-17 DIAGNOSIS — D696 Thrombocytopenia, unspecified: Secondary | ICD-10-CM | POA: Diagnosis present

## 2017-07-17 DIAGNOSIS — E43 Unspecified severe protein-calorie malnutrition: Secondary | ICD-10-CM | POA: Diagnosis present

## 2017-07-17 DIAGNOSIS — J90 Pleural effusion, not elsewhere classified: Secondary | ICD-10-CM | POA: Diagnosis present

## 2017-07-17 DIAGNOSIS — K652 Spontaneous bacterial peritonitis: Secondary | ICD-10-CM | POA: Diagnosis present

## 2017-07-17 LAB — COMPREHENSIVE METABOLIC PANEL
ALBUMIN: 1.7 g/dL — AB (ref 3.5–5.0)
ALK PHOS: 47 U/L (ref 38–126)
ALT: 10 U/L — AB (ref 17–63)
ANION GAP: 9 (ref 5–15)
AST: 19 U/L (ref 15–41)
BUN: 33 mg/dL — AB (ref 6–20)
CALCIUM: 8.7 mg/dL — AB (ref 8.9–10.3)
CO2: 19 mmol/L — AB (ref 22–32)
CREATININE: 2.44 mg/dL — AB (ref 0.61–1.24)
Chloride: 103 mmol/L (ref 101–111)
GFR calc Af Amer: 32 mL/min — ABNORMAL LOW (ref >60)
GFR calc non Af Amer: 28 mL/min — ABNORMAL LOW (ref >60)
GLUCOSE: 94 mg/dL (ref 65–99)
Potassium: 4.3 mmol/L (ref 3.5–5.1)
SODIUM: 131 mmol/L — AB (ref 135–145)
Total Bilirubin: 2 mg/dL — ABNORMAL HIGH (ref 0.3–1.2)
Total Protein: 6.8 g/dL (ref 6.5–8.1)

## 2017-07-17 LAB — GLUCOSE, CAPILLARY
GLUCOSE-CAPILLARY: 67 mg/dL (ref 65–99)
Glucose-Capillary: 79 mg/dL (ref 65–99)
Glucose-Capillary: 93 mg/dL (ref 65–99)
Glucose-Capillary: 73 mg/dL (ref 65–99)

## 2017-07-17 LAB — URINALYSIS, ROUTINE W REFLEX MICROSCOPIC
Glucose, UA: NEGATIVE mg/dL
Ketones, ur: NEGATIVE mg/dL
Leukocytes, UA: NEGATIVE
Nitrite: NEGATIVE
Protein, ur: 30 mg/dL — AB
Specific Gravity, Urine: 1.018 (ref 1.005–1.030)
pH: 5 (ref 5.0–8.0)

## 2017-07-17 LAB — RAPID URINE DRUG SCREEN, HOSP PERFORMED
Amphetamines: NOT DETECTED
Barbiturates: NOT DETECTED
Benzodiazepines: NOT DETECTED
Cocaine: NOT DETECTED
Opiates: NOT DETECTED
Tetrahydrocannabinol: NOT DETECTED

## 2017-07-17 LAB — GRAM STAIN

## 2017-07-17 LAB — LACTATE DEHYDROGENASE, PLEURAL OR PERITONEAL FLUID: LD, Fluid: 54 U/L — ABNORMAL HIGH (ref 3–23)

## 2017-07-17 LAB — BODY FLUID CELL COUNT WITH DIFFERENTIAL
EOS FL: 0 %
LYMPHS FL: 0 %
Monocyte-Macrophage-Serous Fluid: 10 % — ABNORMAL LOW (ref 50–90)
NEUTROPHIL FLUID: 89 % — AB (ref 0–25)
Other Cells, Fluid: 0 %
WBC FLUID: 1459 uL — AB (ref 0–1000)

## 2017-07-17 LAB — CBC
HEMATOCRIT: 29.8 % — AB (ref 39.0–52.0)
HEMOGLOBIN: 10.3 g/dL — AB (ref 13.0–17.0)
MCH: 33.8 pg (ref 26.0–34.0)
MCHC: 34.6 g/dL (ref 30.0–36.0)
MCV: 97.7 fL (ref 78.0–100.0)
Platelets: 95 10*3/uL — ABNORMAL LOW (ref 150–400)
RBC: 3.05 MIL/uL — AB (ref 4.22–5.81)
RDW: 15 % (ref 11.5–15.5)
WBC: 8.8 10*3/uL (ref 4.0–10.5)

## 2017-07-17 LAB — HEMOGLOBIN A1C
HEMOGLOBIN A1C: 4.5 % — AB (ref 4.8–5.6)
MEAN PLASMA GLUCOSE: 82.45 mg/dL

## 2017-07-17 LAB — ALBUMIN, PLEURAL OR PERITONEAL FLUID: Albumin, Fluid: <1 g/dL

## 2017-07-17 LAB — PROTEIN, PLEURAL OR PERITONEAL FLUID: Total protein, fluid: <3 g/dL

## 2017-07-17 LAB — AMYLASE, PLEURAL OR PERITONEAL FLUID: Amylase, Fluid: 34 U/L

## 2017-07-17 LAB — GLUCOSE, PLEURAL OR PERITONEAL FLUID: GLUCOSE FL: 81 mg/dL

## 2017-07-17 MED ORDER — SPIRONOLACTONE 25 MG PO TABS
50.0000 mg | ORAL_TABLET | Freq: Every day | ORAL | Status: DC
  Administered 2017-07-18 – 2017-07-19 (×2): 50 mg via ORAL
  Filled 2017-07-17 (×3): qty 2

## 2017-07-17 MED ORDER — FUROSEMIDE 10 MG/ML IJ SOLN
40.0000 mg | Freq: Two times a day (BID) | INTRAMUSCULAR | Status: DC
  Administered 2017-07-17 – 2017-07-18 (×2): 40 mg via INTRAVENOUS
  Filled 2017-07-17 (×2): qty 4

## 2017-07-17 NOTE — Progress Notes (Signed)
ANTIBIOTIC CONSULT NOTE  Pharmacy Consult for Ceftriaxone Indication: Spontaneous bacterial peritonitis  No Known Allergies  Patient Measurements: Height: 5\' 9"  (175.3 cm) Weight: 216 lb 8 oz (98.2 kg) IBW/kg (Calculated) : 70.7  Vital Signs: Temp: 97.8 F (36.6 C) (08/27 0543) Temp Source: Oral (08/27 0543) BP: 106/72 (08/27 0950) Pulse Rate: 102 (08/27 0950)  Labs:  Recent Labs  07/16/17 1555 07/17/17 0624  WBC 8.3 8.8  HGB 10.2* 10.3*  PLT 103* 95*  CREATININE 2.39* 2.44*   Estimated Creatinine Clearance: 39.1 mL/min (A) (by C-G formula based on SCr of 2.44 mg/dL (H)).  No results for input(s): VANCOTROUGH, VANCOPEAK, VANCORANDOM, GENTTROUGH, GENTPEAK, GENTRANDOM, TOBRATROUGH, TOBRAPEAK, TOBRARND, AMIKACINPEAK, AMIKACINTROU, AMIKACIN in the last 72 hours.   Microbiology: Recent Results (from the past 720 hour(s))  Gram stain     Status: None   Collection Time: 07/17/17  9:20 AM  Result Value Ref Range Status   Specimen Description ASCITIC  Final   Special Requests NONE  Final   Gram Stain   Final    CYTOSPIN SMEAR NO ORGANISMS SEEN WBC SEEN WBC PRESENT,BOTH PMN AND MONONUCLEAR    Report Status 07/17/2017 FINAL  Final    Medical History: Past Medical History:  Diagnosis Date  . Alcohol abuse   . Diabetes mellitus without complication (HCC)   . Gout   . Hypertension    Medications Prior to Admission  Medication Sig Dispense Refill  . Multiple Vitamins-Minerals (MULTIVITAMIN ADULTS 50+) TABS Take 1 tablet by mouth daily.     Assessment: 56 yo male with hx of alcohol abuse seen in the ED for complaints of weakness and abdominal distension. CT showed cirrhosis and large abdominopelvic ascites. Pt also has pleural effusions. Pneumonia not excluded. Pharmacy has been consulted for ceftriaxone dosing.  Goal of Therapy:  Eradicated infection  Plan: Rocephin 2gm IV q24hrs Monitor labs, progress, c/s  Valrie Hart A, RPH 07/17/2017,12:21 PM

## 2017-07-17 NOTE — Progress Notes (Signed)
PROGRESS NOTE    Shawn [name]  WJX:[id number] DOB: 03/09/1961 DOA: 07/16/2017 PCP: Patient, No Pcp Per    Brief Narrative:  56 -year-old woman with a history of previous alcohol abuse, quit drinking 4 months ago, presents to the hospital with generalized weakness and abdominal distention. Found to have significant ascites and general anasarca with volume overload. He was admitted for further evaluation.   Assessment & Plan:   Active Problems:   Ascites due to alcoholic cirrhosis (HCC)   Thrombocytopenia (HCC)   Pleural effusion, left   SBP (spontaneous bacterial peritonitis) (HCC)   Hypoalbuminemia   Severe malnutrition (HCC)   AKI (acute kidney injury) (HCC)   1. Decompensated alcoholic cirrhosis. Patient reports he stopped drinking approximately 4 months ago. Patient underwent paracentesis today with removal of 4.2 L of fluid. He was still appears to be grossly volume overloaded. We'll start the patient on Lasix and Aldactone. Monitor intake and output. He is never started and evaluated by gastroenterology. Will request consultation to help optimize his medication regimen and since he will need further follow-up. 2. Possible spontaneous bacterial peritonitis. Peritoneal fluid analysis indicates elevated WBC count 1459. He is empirically started on Rocephin. Continue to monitor 3. Thrombocytopenia related to cirrhosis. No evidence of bleeding at this time. Continue to monitor 4. Severe malnutrition. We'll request interventional input. 5. Acute kidney injury. Elevated creatinine on admission. Unclear if he has a component of chronic kidney disease since no prior records are available. He has not seen a primary care physician in many years. Kidneys appear normal size on imaging. Recheck labs in a.m. Monitor urine output. 6. Left pleural effusion. Noted on chest x-ray. He does not appear significantly short of breath at this time. Continue diuresis and monitor.   DVT prophylaxis:  SCDs Code Status: DO NOT RESUSCITATE Family Communication: No family present Disposition Plan: May need placement   Consultants:     Procedures:   Paracentesis with removal of 4.2 L of fluid  Antimicrobials:   Ceftriaxone 8/26>>   Subjective: Feeling better after paracentesis. Abdomen is still distended. Denies shortness of breath.  Objective: Vitals:   07/17/17 0543 07/17/17 0908 07/17/17 0950 07/17/17 1315  BP: 103/69 107/80 106/72 95/62  Pulse: (!) 105 100 (!) 102 (!) 101  Resp: 20 20 18 18   Temp: 97.8 F (36.6 C)   97.6 F (36.4 C)  TempSrc: Oral   Oral  SpO2: 100% 100% 98% 100%  Weight:      Height:        Intake/Output Summary (Last 24 hours) at 07/17/17 1830 Last data filed at 07/17/17 1244  Gross per 24 hour  Intake              953 ml  Output              150 ml  Net              803 ml   Filed Weights   07/16/17 2112  Weight: 98.2 kg (216 lb 8 oz)    Examination:  General exam: Appears calm and comfortable, cachetic  Respiratory system: Clear to auscultation. Respiratory effort normal. Cardiovascular system: S1 & S2 heard, RRR. No JVD, murmurs, rubs, gallops or clicks. 2-3+ pedal edema. Gastrointestinal system: Abdomen is distended, soft and nontender. No organomegaly or masses felt. Normal bowel sounds heard. Central nervous system: Alert and oriented. No focal neurological deficits. Extremities: Symmetric 5 x 5 power. Skin: No rashes, lesions or ulcers Psychiatry: Judgement and  insight appear normal. Mood & affect appropriate.     Data Reviewed: I have personally reviewed following labs and imaging studies  CBC:  Recent Labs Lab 07/16/17 1555 07/17/17 0624  WBC 8.3 8.8  NEUTROABS 6.5  --   HGB 10.2* 10.3*  HCT 29.4* 29.8*  MCV 98.3 97.7  PLT 103* 95*   Basic Metabolic Panel:  Recent Labs Lab 07/16/17 1555 07/17/17 0624  NA 129* 131*  K 4.2 4.3  CL 101 103  CO2 18* 19*  GLUCOSE 136* 94  BUN 30* 33*  CREATININE 2.39*  2.44*  CALCIUM 8.4* 8.7*   GFR: Estimated Creatinine Clearance: 39.1 mL/min (A) (by C-G formula based on SCr of 2.44 mg/dL (H)). Liver Function Tests:  Recent Labs Lab 07/16/17 1555 07/17/17 0624  AST 29 19  ALT 11* 10*  ALKPHOS 51 47  BILITOT 2.9* 2.0*  PROT 7.2 6.8  ALBUMIN 1.8* 1.7*   No results for input(s): LIPASE, AMYLASE in the last 168 hours. No results for input(s): AMMONIA in the last 168 hours. Coagulation Profile:  Recent Labs Lab 07/16/17 1555  INR 1.97   Cardiac Enzymes:  Recent Labs Lab 07/16/17 1555  TROPONINI <0.03   BNP (last 3 results) No results for input(s): PROBNP in the last 8760 hours. HbA1C:  Recent Labs  07/17/17 0623  HGBA1C 4.5*   CBG:  Recent Labs Lab 07/17/17 0542 07/17/17 0802 07/17/17 1112 07/17/17 1614  GLUCAP 79 93 67 73   Lipid Profile: No results for input(s): CHOL, HDL, LDLCALC, TRIG, CHOLHDL, LDLDIRECT in the last 72 hours. Thyroid Function Tests: No results for input(s): TSH, T4TOTAL, FREET4, T3FREE, THYROIDAB in the last 72 hours. Anemia Panel: No results for input(s): VITAMINB12, FOLATE, FERRITIN, TIBC, IRON, RETICCTPCT in the last 72 hours. Sepsis Labs:  Recent Labs Lab 07/16/17 1555 07/16/17 1800  LATICACIDVEN 3.4* 3.3*    Recent Results (from the past 240 hour(s))  Gram stain     Status: None   Collection Time: 07/17/17  9:20 AM  Result Value Ref Range Status   Specimen Description ASCITIC  Final   Special Requests NONE  Final   Gram Stain   Final    CYTOSPIN SMEAR NO ORGANISMS SEEN WBC SEEN WBC PRESENT,BOTH PMN AND MONONUCLEAR    Report Status 07/17/2017 FINAL  Final         Radiology Studies: Ct Abdomen Pelvis Wo Contrast  Result Date: 07/16/2017 CLINICAL DATA:  Abdominal distention, weakness.  Heavy drinker. EXAM: CT ABDOMEN AND PELVIS WITHOUT CONTRAST TECHNIQUE: Multidetector CT imaging of the abdomen and pelvis was performed following the standard protocol without IV contrast.  COMPARISON:  None. FINDINGS: Lower chest: Moderate left and small right pleural effusion. Patchy right lower lobe opacity, likely atelectasis, pneumonia not excluded. Patchy left lower lobe opacity, likely atelectasis. Hepatobiliary: Shrunken, nodular liver, reflecting a cirrhotic configuration. No focal hepatic lesion is seen. Gallbladder is contracted with multiple small gallstones (series 2/image 28). Pancreas: Within normal limits. Spleen: Normal in size. Adrenals/Urinary Tract: Adrenal glands are within normal limits. Kidneys are within normal limits.  No renal or ureteral calculi. Bladder is not discretely visualized. Stomach/Bowel: Stomach is within normal limits. Visualized bowel is grossly unremarkable. No evidence of bowel obstruction. Vascular/Lymphatic: No evidence of abdominal aortic aneurysm. Mild atherosclerotic calcifications. No gross abdominopelvic lymphadenopathy, although poorly evaluated. Reproductive: Prostate is unremarkable. Other: Large volume abdominopelvic ascites. Musculoskeletal: Mild degenerative changes of the visualized thoracolumbar spine, most prominent at L4-5. IMPRESSION: Cirrhosis.  No focal hepatic lesion is  seen. Large volume abdominopelvic ascites. Moderate left and small right pleural effusions. Patchy bilateral lower lobe opacities, likely atelectasis, right lower lobe pneumonia not excluded. Electronically Signed   By: Charline Bills M.D.   On: 07/16/2017 18:17   Dg Chest 2 View  Result Date: 07/16/2017 CLINICAL DATA:  Cough and weakness EXAM: CHEST  2 VIEW COMPARISON:  02/05/2015 FINDINGS: Cardiac shadow is stable. The overall inspiratory effort is poor with bibasilar atelectatic changes. Left pleural effusion is noted similar to that seen on recent chest CT. No other focal abnormality is noted. IMPRESSION: Bibasilar atelectasis and left-sided pleural effusion. Electronically Signed   By: Alcide Clever M.D.   On: 07/16/2017 18:15   US Paracentesis  Result Date:  07/17/2017 INDICATION: Ascites. EXAM: ULTRASOUND GUIDED RIGHT PARACENTESIS MEDICATIONS: None. COMPLICATIONS: None immediate. PROCEDURE: Informed written consent was obtained from the patient after a discussion of the risks, benefits and alternatives to treatment. A timeout was performed prior to the initiation of the procedure. Initial ultrasound scanning demonstrates a large amount of ascites within the right lower abdominal quadrant. The right lower abdomen was prepped and draped in the usual sterile fashion. 1% lidocaine with epinephrine was used for local anesthesia. Following this, a Yuen catheter was introduced. An ultrasound image was saved for documentation purposes. The paracentesis was performed. The catheter was removed and a dressing was applied. The patient tolerated the procedure well without immediate post procedural complication. FINDINGS: A total of approximately 4.2 L of straw-colored fluid was removed. Samples were sent to the laboratory as requested by the clinical team. IMPRESSION: Successful ultrasound-guided paracentesis yielding 4.2 liters of peritoneal fluid. Electronically Signed   By: Francene Boyers M.D.   On: 07/17/2017 10:05        Scheduled Meds: . furosemide  40 mg Intravenous BID  . insulin aspart  0-9 Units Subcutaneous TID WC  . spironolactone  50 mg Oral Daily   Continuous Infusions: . sodium chloride 10 mL/hr at 07/16/17 2342  . cefTRIAXone (ROCEPHIN)  IV Stopped (07/17/17 0012)     LOS: 1 day    Time spent:    Alasha Mcguinness, MD [facility] Pager [phone]  If 7PM-7AM, please contact night-coverage [email or web address] Password [email or web address] 07/17/2017, 6:30 PM

## 2017-07-17 NOTE — Progress Notes (Signed)
Patient's urinary output is 100 cc since admitted to the floor last night. He denies discomfort or the urge to urinate. Unable to bladder scan, because of his distended abdomen. He is for Paracentesis today. Order received from Dr. Sharl Ma to catheterize pt, but unable to catheterize, because of the way his penis is structured anatomically. Dr. Sharl Ma had been notified. He would like to have paracentesis done this morning so bladder scan can be performed. Information discussed with house supervisor and he spoke with radiology department. They will have patient after 8:30 this morning for procedure.

## 2017-07-17 NOTE — Progress Notes (Signed)
Paracentesis complete no signs of distress, 4L yellow colored ascites removed.    

## 2017-07-18 ENCOUNTER — Encounter (HOSPITAL_COMMUNITY): Payer: Self-pay | Admitting: Gastroenterology

## 2017-07-18 DIAGNOSIS — R188 Other ascites: Secondary | ICD-10-CM

## 2017-07-18 DIAGNOSIS — N179 Acute kidney failure, unspecified: Secondary | ICD-10-CM

## 2017-07-18 DIAGNOSIS — E43 Unspecified severe protein-calorie malnutrition: Secondary | ICD-10-CM | POA: Diagnosis present

## 2017-07-18 DIAGNOSIS — K7031 Alcoholic cirrhosis of liver with ascites: Principal | ICD-10-CM

## 2017-07-18 DIAGNOSIS — K7460 Unspecified cirrhosis of liver: Secondary | ICD-10-CM

## 2017-07-18 LAB — COMPREHENSIVE METABOLIC PANEL
ALT: 7 U/L — ABNORMAL LOW (ref 17–63)
ANION GAP: 7 (ref 5–15)
AST: 18 U/L (ref 15–41)
Albumin: 1.6 g/dL — ABNORMAL LOW (ref 3.5–5.0)
Alkaline Phosphatase: 46 U/L (ref 38–126)
BILIRUBIN TOTAL: 1.3 mg/dL — AB (ref 0.3–1.2)
BUN: 41 mg/dL — AB (ref 6–20)
CALCIUM: 8.4 mg/dL — AB (ref 8.9–10.3)
CO2: 19 mmol/L — ABNORMAL LOW (ref 22–32)
CREATININE: 2.86 mg/dL — AB (ref 0.61–1.24)
Chloride: 107 mmol/L (ref 101–111)
GFR, EST AFRICAN AMERICAN: 27 mL/min — AB (ref >60)
GFR, EST NON AFRICAN AMERICAN: 23 mL/min — AB (ref >60)
Glucose, Bld: 105 mg/dL — ABNORMAL HIGH (ref 65–99)
POTASSIUM: 4.5 mmol/L (ref 3.5–5.1)
Sodium: 133 mmol/L — ABNORMAL LOW (ref 135–145)
Total Protein: 6 g/dL — ABNORMAL LOW (ref 6.5–8.1)

## 2017-07-18 LAB — GLUCOSE, CAPILLARY
GLUCOSE-CAPILLARY: 110 mg/dL — AB (ref 65–99)
Glucose-Capillary: 92 mg/dL (ref 65–99)
Glucose-Capillary: 137 mg/dL — ABNORMAL HIGH (ref 65–99)
Glucose-Capillary: 111 mg/dL — ABNORMAL HIGH (ref 65–99)
Glucose-Capillary: 147 mg/dL — ABNORMAL HIGH (ref 65–99)

## 2017-07-18 LAB — CBC
HCT: 25.7 % — ABNORMAL LOW (ref 39.0–52.0)
Hemoglobin: 8.8 g/dL — ABNORMAL LOW (ref 13.0–17.0)
MCH: 34 pg (ref 26.0–34.0)
MCHC: 34.2 g/dL (ref 30.0–36.0)
MCV: 99.2 fL (ref 78.0–100.0)
PLATELETS: 78 10*3/uL — AB (ref 150–400)
RBC: 2.59 MIL/uL — ABNORMAL LOW (ref 4.22–5.81)
RDW: 14.3 % (ref 11.5–15.5)
WBC: 8.3 10*3/uL (ref 4.0–10.5)

## 2017-07-18 LAB — URINE CULTURE

## 2017-07-18 LAB — HIV ANTIBODY (ROUTINE TESTING W REFLEX): HIV Screen 4th Generation wRfx: NONREACTIVE

## 2017-07-18 LAB — PROTIME-INR
INR: 1.99
PROTHROMBIN TIME: 22.4 s — AB (ref 11.4–15.2)

## 2017-07-18 MED ORDER — ALBUMIN HUMAN 25 % IV SOLN
25.0000 g | Freq: Two times a day (BID) | INTRAVENOUS | Status: DC
  Administered 2017-07-18 – 2017-07-24 (×12): 25 g via INTRAVENOUS
  Filled 2017-07-18 (×4): qty 100
  Filled 2017-07-18: qty 50
  Filled 2017-07-18 (×10): qty 100

## 2017-07-18 MED ORDER — ENSURE ENLIVE PO LIQD
237.0000 mL | Freq: Two times a day (BID) | ORAL | Status: DC
  Administered 2017-07-18 – 2017-07-20 (×5): 237 mL via ORAL

## 2017-07-18 MED ORDER — ALBUMIN HUMAN 25 % IV SOLN
25.0000 g | Freq: Two times a day (BID) | INTRAVENOUS | Status: DC
  Administered 2017-07-18: 25 g via INTRAVENOUS
  Filled 2017-07-18 (×3): qty 100

## 2017-07-18 MED ORDER — FUROSEMIDE 10 MG/ML IJ SOLN
80.0000 mg | Freq: Two times a day (BID) | INTRAMUSCULAR | Status: DC
  Administered 2017-07-18 – 2017-07-19 (×3): 80 mg via INTRAVENOUS
  Filled 2017-07-18 (×3): qty 8

## 2017-07-18 NOTE — Consult Note (Signed)
Referring Provider: Dr. Kerry Hough  Primary Care Physician:  Patient, No Pcp Per Primary Gastroenterologist:  Dr. Darrick Penna   Date of Admission: 07/16/17 Date of Consultation: 07/18/17  Reason for Consultation: Decompensated cirrhosis   HPI:  Shawn [name] is a 56 y.o. year old male with history of chronic alcohol abuse, presenting with worsening weakness, edema, and abdominal distension, reporting several falls.   States he fell at home, denies syncopal episode. States he saw the ground coming up to him. Larey Seat a week and a half later, knees were weak and tripped on carpet. States he had progressive weakness. Started falling about 3.5 months ago. States his he noted his abdomen becoming swollen about 3.5 months ago. States it didn't bother him or hurt. No abdominal pain. Feels like "my blood is thinner". Notes chills. No fever. Gets colder quicker. No overt GI bleeding. Bowel habits fluctuate: sometimes loose, sometimes hard stool, really depending on what he eats. Legs started swelling more recently. Girlfriend asked him to get help. No confusion. Lives alone. No known reported prior history of liver disease. Hasn't seen a PCP in about 20 years ago. States "I have always been healthy and let it go to my head". States he has been an alcoholic all his life but had no alcohol for almost 4 months. Stopped drinking when he started falling. Appetite started to decrease. States he had mold in his bathroom a few months ago and noted he had been nauseated with vomiting episodes. This resolved after mold treatment. No further N/V.    States he hasn't urinated in about 24 hours. CT this admission with cirrhosis. US paracentesis with 4.2 liters. Cell count with absolute PMN count greater than 250 (in the 1000 range). Fluid cultures still pending. Started on Rocephin IV on 8/26, receiving 2 doses thus far.    Past Medical History:  Diagnosis Date  . Alcohol abuse   . Diabetes mellitus without complication (HCC)    . Gout   . Hypertension     Past Surgical History:  Procedure Laterality Date  . none      Prior to Admission medications   Medication Sig Start Date End Date Taking? Authorizing Provider  Multiple Vitamins-Minerals (MULTIVITAMIN ADULTS 50+) TABS Take 1 tablet by mouth daily.   Yes [provider]    Current Facility-Administered Medications  Medication Dose Route Frequency Provider Last Rate Last Dose  . 0.9 %  sodium chloride infusion   Intravenous Continuous Meredeth Ide, MD 10 mL/hr at 07/16/17 2342    . albumin human 25 % solution 25 g  25 g Intravenous BID Erick Blinks, MD      . cefTRIAXone (ROCEPHIN) 2 g in dextrose 5 % 50 mL IVPB  2 g Intravenous Q24H Meredeth Ide, MD   Stopped at 07/17/17 2345  . furosemide (LASIX) injection 40 mg  40 mg Intravenous BID Erick Blinks, MD   40 mg at 07/18/17 0856  . insulin aspart (novoLOG) injection 0-9 Units  0-9 Units Subcutaneous TID WC Sharl Ma, Sarina Ill, MD      . ondansetron (ZOFRAN) tablet 4 mg  4 mg Oral Q6H PRN Meredeth Ide, MD       Or  . ondansetron (ZOFRAN) injection 4 mg  4 mg Intravenous Q6H PRN Meredeth Ide, MD      . spironolactone (ALDACTONE) tablet 50 mg  50 mg Oral Daily Erick Blinks, MD   50 mg at 07/18/17 0856    Allergies as  of 07/16/2017  . (No Known Allergies)    Family History  Problem Relation Age of Onset  . Colon cancer Neg Hx   . Colon polyps Neg Hx     Social History   Social History  . Marital status: Single    Spouse name: N/A  . Number of children: N/A  . Years of education: N/A   Occupational History  . Not on file.   Social History Main Topics  . Smoking status: Never Smoker  . Smokeless tobacco: Current User  . Alcohol use Yes     Comment: stopped drinking 3 months ago, but normally 6-10 beers a day.   . Drug use: No  . Sexual activity: Not on file   Other Topics Concern  . Not on file   Social History Narrative  . No narrative on file    Review of  Systems: As mentioned in HPI   Physical Exam: Vital signs in last 24 hours: Temp:  [97.5 F (36.4 C)-97.9 F (36.6 C)] 97.9 F (36.6 C) (08/28 0643) Pulse Rate:  [95-103] 103 (08/28 0643) Resp:  [18] 18 (08/28 0643) BP: (95-106)/(59-72) 103/59 (08/28 0643) SpO2:  [92 %-100 %] 99 % (08/28 0643) Last BM Date: 07/16/17 General:   Alert, appears chronically ill, thin, with muscle wasting in upper extremities  Head:  Normocephalic and atraumatic. Eyes:  Sclera clear, no icterus.  Ears:  Normal auditory acuity. Nose:  No deformity, discharge,  or lesions. Mouth:  No deformity or lesions, dentition normal. Lungs:  Clear throughout to auscultation. Diminished bases Heart:  S1 S2 present without murmurs Abdomen:  Largely distended with ascites but not tense, no TTP, anasarca in flanks Rectal:  Deferred  Extremities:  With 3+ pitting edema bilaterally entire lower extremities  Neurologic:  Alert and  oriented x4;  Psych:  Alert and cooperative. Normal mood and affect.  Intake/Output from previous day: 08/27 0701 - 08/28 0700 In: 283 [I.V.:183; IV Piggyback:100] Out: -  Intake/Output this shift: No intake/output data recorded.  Lab Results:  Recent Labs  07/16/17 1555 07/17/17 0624 07/18/17 0430  WBC 8.3 8.8 8.3  HGB 10.2* 10.3* 8.8*  HCT 29.4* 29.8* 25.7*  PLT 103* 95* 78*   BMET  Recent Labs  07/16/17 1555 07/17/17 0624 07/18/17 0430  NA 129* 131* 133*  K 4.2 4.3 4.5  CL 101 103 107  CO2 18* 19* 19*  GLUCOSE 136* 94 105*  BUN 30* 33* 41*  CREATININE 2.39* 2.44* 2.86*  CALCIUM 8.4* 8.7* 8.4*   LFT  Recent Labs  07/16/17 1555 07/17/17 0624 07/18/17 0430  PROT 7.2 6.8 6.0*  ALBUMIN 1.8* 1.7* 1.6*  AST 29 19 18   ALT 11* 10* 7*  ALKPHOS 51 47 46  BILITOT 2.9* 2.0* 1.3*   PT/INR  Recent Labs  07/16/17 1555  LABPROT 22.8*  INR 1.97    Studies/Results: Ct Abdomen Pelvis Wo Contrast  Result Date: 07/16/2017 CLINICAL DATA:  Abdominal distention,  weakness.  Heavy drinker. EXAM: CT ABDOMEN AND PELVIS WITHOUT CONTRAST TECHNIQUE: Multidetector CT imaging of the abdomen and pelvis was performed following the standard protocol without IV contrast. COMPARISON:  None. FINDINGS: Lower chest: Moderate left and small right pleural effusion. Patchy right lower lobe opacity, likely atelectasis, pneumonia not excluded. Patchy left lower lobe opacity, likely atelectasis. Hepatobiliary: Shrunken, nodular liver, reflecting a cirrhotic configuration. No focal hepatic lesion is seen. Gallbladder is contracted with multiple small gallstones (series 2/image 28). Pancreas: Within normal limits. Spleen: Normal in  size. Adrenals/Urinary Tract: Adrenal glands are within normal limits. Kidneys are within normal limits.  No renal or ureteral calculi. Bladder is not discretely visualized. Stomach/Bowel: Stomach is within normal limits. Visualized bowel is grossly unremarkable. No evidence of bowel obstruction. Vascular/Lymphatic: No evidence of abdominal aortic aneurysm. Mild atherosclerotic calcifications. No gross abdominopelvic lymphadenopathy, although poorly evaluated. Reproductive: Prostate is unremarkable. Other: Large volume abdominopelvic ascites. Musculoskeletal: Mild degenerative changes of the visualized thoracolumbar spine, most prominent at L4-5. IMPRESSION: Cirrhosis.  No focal hepatic lesion is seen. Large volume abdominopelvic ascites. Moderate left and small right pleural effusions. Patchy bilateral lower lobe opacities, likely atelectasis, right lower lobe pneumonia not excluded. Electronically Signed   By: Charline Bills M.D.   On: 07/16/2017 18:17   Dg Chest 2 View  Result Date: 07/16/2017 CLINICAL DATA:  Cough and weakness EXAM: CHEST  2 VIEW COMPARISON:  02/05/2015 FINDINGS: Cardiac shadow is stable. The overall inspiratory effort is poor with bibasilar atelectatic changes. Left pleural effusion is noted similar to that seen on recent chest CT. No other  focal abnormality is noted. IMPRESSION: Bibasilar atelectasis and left-sided pleural effusion. Electronically Signed   By: Alcide Clever M.D.   On: 07/16/2017 18:15   US Paracentesis  Result Date: 07/17/2017 INDICATION: Ascites. EXAM: ULTRASOUND GUIDED RIGHT PARACENTESIS MEDICATIONS: None. COMPLICATIONS: None immediate. PROCEDURE: Informed written consent was obtained from the patient after a discussion of the risks, benefits and alternatives to treatment. A timeout was performed prior to the initiation of the procedure. Initial ultrasound scanning demonstrates a large amount of ascites within the right lower abdominal quadrant. The right lower abdomen was prepped and draped in the usual sterile fashion. 1% lidocaine with epinephrine was used for local anesthesia. Following this, a Yuen catheter was introduced. An ultrasound image was saved for documentation purposes. The paracentesis was performed. The catheter was removed and a dressing was applied. The patient tolerated the procedure well without immediate post procedural complication. FINDINGS: A total of approximately 4.2 L of straw-colored fluid was removed. Samples were sent to the laboratory as requested by the clinical team. IMPRESSION: Successful ultrasound-guided paracentesis yielding 4.2 liters of peritoneal fluid. Electronically Signed   By: Francene Boyers M.D.   On: 07/17/2017 10:05    Impression: 56 year old male with decompensated cirrhosis in setting of chronic ETOH abuse, last drink several months ago, with ascitic fluid concerning for SBP. Started on Rocephin IV, receiving 2 doses thus far. Appears chronically ill, and renal function is worsening. Anemia noted but without overt GI bleeding, multifactorial in this scenario.   Discussed absolute ETOH cessation. Agree with DNR status. Overall grim prognosis. Remains to be seen how he responds to supportive measures, antibiotic therapy. Would benefit from repeat paracentesis in near future  for therapeutic purposes but is non-tense at time of consultation. Discussed with Dr. Kerry Hough possible nephrology consultation due to worsening renal function.   Plan: Continue Rocephin  Will await pending fluid cultures Strict I/Os Recommend nephrology consultation Anticipate repeat paracentesis in future ETOH cessation Will check viral markers to have on file; suspected ETOH cirrhosis at this time Check INR Will follow with you  Gelene Mink, PhD, ANP-BC [facility]      LOS: 2 days    07/18/2017, 9:28 AM

## 2017-07-18 NOTE — Consult Note (Signed)
Reason for Consult: Renal failure Referring Physician: Dr. Alethia Berthold is an 56 y.o. male.  HPI: He is a patient was history of hypertension, diabetes, long-standing alcohol abuse presently came with complaints of weakness, increasing abdominal girth and some difficulty breathing. According to the patient and some swelling of the legs for the last 4 or 5 years however abdominal distention is very new. Since patient started having repeated fall he stopped drinking about 4 months ago. Since then his condition continued to decline. He denies any previous history of renal failure or kidney stone. The last time he saw a physician was about 2 years ago. Presently he said he is feeling somewhat better. He doesn't have any nausea or vomiting.  Past Medical History:  Diagnosis Date  . Alcohol abuse   . Diabetes mellitus without complication ([place])   . Gout   . Hypertension     Past Surgical History:  Procedure Laterality Date  . none      Family History  Problem Relation Age of Onset  . Colon cancer Neg Hx   . Colon polyps Neg Hx     Social History:  reports that he has never smoked. He uses smokeless tobacco. He reports that he drinks alcohol. He reports that he does not use drugs.  Allergies: No Known Allergies  Medications: I have reviewed the patient's current medications.  Results for orders placed or performed during the hospital encounter of 07/16/17 (from the past 48 hour(s))  Lactic acid, plasma     Status: Abnormal   Collection Time: 07/16/17  6:00 PM  Result Value Ref Range   Lactic Acid, Venous 3.3 (HH) 0.5 - 1.9 mmol/L    Comment: CRITICAL RESULT CALLED TO, READ BACK BY AND VERIFIED WITH:  BERTRAND,S @ 1849 ON 07/16/17 BY JUW   Urine rapid drug screen (hosp performed)     Status: None   Collection Time: 07/17/17 12:10 AM  Result Value Ref Range   Opiates NONE DETECTED NONE DETECTED   Cocaine NONE DETECTED NONE DETECTED   Benzodiazepines NONE DETECTED NONE  DETECTED   Amphetamines NONE DETECTED NONE DETECTED   Tetrahydrocannabinol NONE DETECTED NONE DETECTED   Barbiturates NONE DETECTED NONE DETECTED    Comment:        DRUG SCREEN FOR MEDICAL PURPOSES ONLY.  IF CONFIRMATION IS NEEDED FOR ANY PURPOSE, NOTIFY LAB WITHIN 5 DAYS.        LOWEST DETECTABLE LIMITS FOR URINE DRUG SCREEN Drug Class       Cutoff (ng/mL) Amphetamine      1000 Barbiturate      200 Benzodiazepine   751 Tricyclics       025 Opiates          300 Cocaine          300 THC              50   Urine culture     Status: Abnormal   Collection Time: 07/17/17 12:10 AM  Result Value Ref Range   Specimen Description URINE, CLEAN CATCH    Special Requests NONE    Culture MULTIPLE SPECIES PRESENT, SUGGEST RECOLLECTION (A)    Report Status 07/18/2017 FINAL   Urinalysis, Routine w reflex microscopic     Status: Abnormal   Collection Time: 07/17/17 12:27 AM  Result Value Ref Range   Color, Urine AMBER (A) YELLOW    Comment: BIOCHEMICALS MAY BE AFFECTED BY COLOR   APPearance HAZY (A) CLEAR  Specific Gravity, Urine 1.018 1.005 - 1.030   pH 5.0 5.0 - 8.0   Glucose, UA NEGATIVE NEGATIVE mg/dL   Hgb urine dipstick LARGE (A) NEGATIVE   Bilirubin Urine SMALL (A) NEGATIVE   Ketones, ur NEGATIVE NEGATIVE mg/dL   Protein, ur 30 (A) NEGATIVE mg/dL   Nitrite NEGATIVE NEGATIVE   Leukocytes, UA NEGATIVE NEGATIVE   RBC / HPF TOO NUMEROUS TO COUNT 0 - 5 RBC/hpf   WBC, UA 6-30 0 - 5 WBC/hpf   Bacteria, UA RARE (A) NONE SEEN   Squamous Epithelial / LPF 6-30 (A) NONE SEEN   Hyaline Casts, UA PRESENT   Glucose, capillary     Status: None   Collection Time: 07/17/17  5:42 AM  Result Value Ref Range   Glucose-Capillary 79 65 - 99 mg/dL  Hemoglobin A1c     Status: Abnormal   Collection Time: 07/17/17  6:23 AM  Result Value Ref Range   Hgb A1c MFr Bld 4.5 (L) 4.8 - 5.6 %    Comment: (NOTE) Pre diabetes:          5.7%-6.4% Diabetes:              >6.4% Glycemic control for    <7.0% adults with diabetes    Mean Plasma Glucose 82.45 mg/dL    Comment: Performed at [facility], [address], [place], [place] [address]  HIV antibody (Routine Testing)     Status: None   Collection Time: 07/17/17  6:24 AM  Result Value Ref Range   HIV Screen 4th Generation wRfx Non Reactive Non Reactive    Comment: (NOTE) Performed At: [facility] [place], [place] [id number] Lindon Romp MD QP:[id number]   CBC     Status: Abnormal   Collection Time: 07/17/17  6:24 AM  Result Value Ref Range   WBC 8.8 4.0 - 10.5 K/uL   RBC 3.05 (L) 4.22 - 5.81 MIL/uL   Hemoglobin 10.3 (L) 13.0 - 17.0 g/dL   HCT 29.8 (L) 39.0 - 52.0 %   MCV 97.7 78.0 - 100.0 fL   MCH 33.8 26.0 - 34.0 pg   MCHC 34.6 30.0 - 36.0 g/dL   RDW 15.0 11.5 - 15.5 %   Platelets 95 (L) 150 - 400 K/uL    Comment: SPECIMEN CHECKED FOR CLOTS  Comprehensive metabolic panel     Status: Abnormal   Collection Time: 07/17/17  6:24 AM  Result Value Ref Range   Sodium 131 (L) 135 - 145 mmol/L   Potassium 4.3 3.5 - 5.1 mmol/L   Chloride 103 101 - 111 mmol/L   CO2 19 (L) 22 - 32 mmol/L   Glucose, Bld 94 65 - 99 mg/dL   BUN 33 (H) 6 - 20 mg/dL   Creatinine, Ser 2.44 (H) 0.61 - 1.24 mg/dL   Calcium 8.7 (L) 8.9 - 10.3 mg/dL   Total Protein 6.8 6.5 - 8.1 g/dL   Albumin 1.7 (L) 3.5 - 5.0 g/dL   AST 19 15 - 41 U/L   ALT 10 (L) 17 - 63 U/L   Alkaline Phosphatase 47 38 - 126 U/L   Total Bilirubin 2.0 (H) 0.3 - 1.2 mg/dL   GFR calc non Af Amer 28 (L) >60 mL/min   GFR calc Af Amer 32 (L) >60 mL/min    Comment: (NOTE) The eGFR has been calculated using the CKD EPI equation. This calculation has not been validated in all clinical situations. eGFR's persistently <60 mL/min  signify possible Chronic Kidney Disease.    Anion gap 9 5 - 15  Glucose, capillary     Status: None   Collection Time: 07/17/17  8:02 AM  Result Value Ref Range   Glucose-Capillary 93 65 - 99 mg/dL  Body fluid cell count  with differential     Status: Abnormal   Collection Time: 07/17/17  9:20 AM  Result Value Ref Range   Fluid Type-FCT CYTOPERI    Color, Fluid YELLOW YELLOW   Appearance, Fluid HAZY (A) CLEAR   WBC, Fluid 1,459 (H) 0 - 1,000 cu mm   Neutrophil Count, Fluid 89 (H) 0 - 25 %   Lymphs, Fluid 0 %   Monocyte-Macrophage-Serous Fluid 10 (L) 50 - 90 %   Eos, Fluid 0 %   Other Cells, Fluid 0 %  Gram stain     Status: None   Collection Time: 07/17/17  9:20 AM  Result Value Ref Range   Specimen Description ASCITIC    Special Requests NONE    Gram Stain      CYTOSPIN SMEAR NO ORGANISMS SEEN WBC SEEN WBC PRESENT,BOTH PMN AND MONONUCLEAR    Report Status 07/17/2017 FINAL   Protein, pleural or peritoneal fluid     Status: None   Collection Time: 07/17/17  9:20 AM  Result Value Ref Range   Total protein, fluid <3.0 g/dL    Comment: (NOTE) No normal range established for this test Results should be evaluated in conjunction with serum values    Fluid Type-FTP ASCITIC   Glucose, pleural or peritoneal fluid     Status: None   Collection Time: 07/17/17  9:20 AM  Result Value Ref Range   Glucose, Fluid 81 mg/dL    Comment: (NOTE) No normal range established for this test Results should be evaluated in conjunction with serum values    Fluid Type-FGLU ASCITIC   Albumin, pleural or peritoneal fluid     Status: None   Collection Time: 07/17/17  9:20 AM  Result Value Ref Range   Albumin, Fluid <1.0 g/dL    Comment: (NOTE) No normal range established for this test Results should be evaluated in conjunction with serum values    Fluid Type-FALB ASCITIC   Amylase, pleural or peritoneal fluid     Status: None   Collection Time: 07/17/17  9:20 AM  Result Value Ref Range   Amylase, Fluid 34 U/L    Comment: NO NORMAL RANGE ESTABLISHED FOR THIS TEST   Fluid Type-FAMY ASCITIC   Lactate dehydrogenase (pleural or peritoneal fluid)     Status: Abnormal   Collection Time: 07/17/17  9:20 AM   Result Value Ref Range   LD, Fluid 54 (H) 3 - 23 U/L    Comment: (NOTE) Results should be evaluated in conjunction with serum values    Fluid Type-FLDH ASCITIC   Culture, body fluid-bottle     Status: None (Preliminary result)   Collection Time: 07/17/17  9:20 AM  Result Value Ref Range   Specimen Description ASCITIC COLLECTED BY DOCTOR    Special Requests BOTTLES DRAWN AEROBIC AND ANAEROBIC 10 CC EACH    Culture NO GROWTH < 24 HOURS    Report Status PENDING   Glucose, capillary     Status: None   Collection Time: 07/17/17 11:12 AM  Result Value Ref Range   Glucose-Capillary 67 65 - 99 mg/dL  Glucose, capillary     Status: None   Collection Time: 07/17/17  4:14 PM  Result Value Ref  Range   Glucose-Capillary 73 65 - 99 mg/dL  Glucose, capillary     Status: Abnormal   Collection Time: 07/18/17 12:28 AM  Result Value Ref Range   Glucose-Capillary 110 (H) 65 - 99 mg/dL   Comment 1 Notify RN    Comment 2 Document in Chart   Comprehensive metabolic panel     Status: Abnormal   Collection Time: 07/18/17  4:30 AM  Result Value Ref Range   Sodium 133 (L) 135 - 145 mmol/L   Potassium 4.5 3.5 - 5.1 mmol/L   Chloride 107 101 - 111 mmol/L   CO2 19 (L) 22 - 32 mmol/L   Glucose, Bld 105 (H) 65 - 99 mg/dL   BUN 41 (H) 6 - 20 mg/dL   Creatinine, Ser 2.86 (H) 0.61 - 1.24 mg/dL   Calcium 8.4 (L) 8.9 - 10.3 mg/dL   Total Protein 6.0 (L) 6.5 - 8.1 g/dL   Albumin 1.6 (L) 3.5 - 5.0 g/dL   AST 18 15 - 41 U/L   ALT 7 (L) 17 - 63 U/L   Alkaline Phosphatase 46 38 - 126 U/L   Total Bilirubin 1.3 (H) 0.3 - 1.2 mg/dL   GFR calc non Af Amer 23 (L) >60 mL/min   GFR calc Af Amer 27 (L) >60 mL/min    Comment: (NOTE) The eGFR has been calculated using the CKD EPI equation. This calculation has not been validated in all clinical situations. eGFR's persistently <60 mL/min signify possible Chronic Kidney Disease.    Anion gap 7 5 - 15  CBC     Status: Abnormal   Collection Time: 07/18/17  4:30 AM   Result Value Ref Range   WBC 8.3 4.0 - 10.5 K/uL   RBC 2.59 (L) 4.22 - 5.81 MIL/uL   Hemoglobin 8.8 (L) 13.0 - 17.0 g/dL   HCT 25.7 (L) 39.0 - 52.0 %   MCV 99.2 78.0 - 100.0 fL   MCH 34.0 26.0 - 34.0 pg   MCHC 34.2 30.0 - 36.0 g/dL   RDW 14.3 11.5 - 15.5 %   Platelets 78 (L) 150 - 400 K/uL    Comment: SPECIMEN CHECKED FOR CLOTS  Glucose, capillary     Status: None   Collection Time: 07/18/17  6:41 AM  Result Value Ref Range   Glucose-Capillary 92 65 - 99 mg/dL   Comment 1 Notify RN    Comment 2 Document in Chart   Glucose, capillary     Status: Abnormal   Collection Time: 07/18/17 11:59 AM  Result Value Ref Range   Glucose-Capillary 137 (H) 65 - 99 mg/dL  Protime-INR     Status: Abnormal   Collection Time: 07/18/17 12:33 PM  Result Value Ref Range   Prothrombin Time 22.4 (H) 11.4 - 15.2 seconds   INR 1.99     Ct Abdomen Pelvis Wo Contrast  Result Date: 07/16/2017 CLINICAL DATA:  Abdominal distention, weakness.  Heavy drinker. EXAM: CT ABDOMEN AND PELVIS WITHOUT CONTRAST TECHNIQUE: Multidetector CT imaging of the abdomen and pelvis was performed following the standard protocol without IV contrast. COMPARISON:  None. FINDINGS: Lower chest: Moderate left and small right pleural effusion. Patchy right lower lobe opacity, likely atelectasis, pneumonia not excluded. Patchy left lower lobe opacity, likely atelectasis. Hepatobiliary: Shrunken, nodular liver, reflecting a cirrhotic configuration. No focal hepatic lesion is seen. Gallbladder is contracted with multiple small gallstones (series 2/image 28). Pancreas: Within normal limits. Spleen: Normal in size. Adrenals/Urinary Tract: Adrenal glands are within normal limits.  Kidneys are within normal limits.  No renal or ureteral calculi. Bladder is not discretely visualized. Stomach/Bowel: Stomach is within normal limits. Visualized bowel is grossly unremarkable. No evidence of bowel obstruction. Vascular/Lymphatic: No evidence of abdominal  aortic aneurysm. Mild atherosclerotic calcifications. No gross abdominopelvic lymphadenopathy, although poorly evaluated. Reproductive: Prostate is unremarkable. Other: Large volume abdominopelvic ascites. Musculoskeletal: Mild degenerative changes of the visualized thoracolumbar spine, most prominent at L4-5. IMPRESSION: Cirrhosis.  No focal hepatic lesion is seen. Large volume abdominopelvic ascites. Moderate left and small right pleural effusions. Patchy bilateral lower lobe opacities, likely atelectasis, right lower lobe pneumonia not excluded. Electronically Signed   By: Julian Hy M.D.   On: 07/16/2017 18:17   Dg Chest 2 View  Result Date: 07/16/2017 CLINICAL DATA:  Cough and weakness EXAM: CHEST  2 VIEW COMPARISON:  02/05/2015 FINDINGS: Cardiac shadow is stable. The overall inspiratory effort is poor with bibasilar atelectatic changes. Left pleural effusion is noted similar to that seen on recent chest CT. No other focal abnormality is noted. IMPRESSION: Bibasilar atelectasis and left-sided pleural effusion. Electronically Signed   By: Inez Catalina M.D.   On: 07/16/2017 18:15   US Paracentesis  Result Date: 07/17/2017 INDICATION: Ascites. EXAM: ULTRASOUND GUIDED RIGHT PARACENTESIS MEDICATIONS: None. COMPLICATIONS: None immediate. PROCEDURE: Informed written consent was obtained from the patient after a discussion of the risks, benefits and alternatives to treatment. A timeout was performed prior to the initiation of the procedure. Initial ultrasound scanning demonstrates a large amount of ascites within the right lower abdominal quadrant. The right lower abdomen was prepped and draped in the usual sterile fashion. 1% lidocaine with epinephrine was used for local anesthesia. Following this, a Yuen catheter was introduced. An ultrasound image was saved for documentation purposes. The paracentesis was performed. The catheter was removed and a dressing was applied. The patient tolerated the  procedure well without immediate post procedural complication. FINDINGS: A total of approximately 4.2 L of straw-colored fluid was removed. Samples were sent to the laboratory as requested by the clinical team. IMPRESSION: Successful ultrasound-guided paracentesis yielding 4.2 liters of peritoneal fluid. Electronically Signed   By: Lorriane Shire M.D.   On: 07/17/2017 10:05    Review of Systems  Constitutional: Positive for malaise/fatigue. Negative for chills and fever.  Respiratory: Positive for shortness of breath.   Cardiovascular: Positive for leg swelling. Negative for chest pain.  Gastrointestinal: Negative for abdominal pain, nausea and vomiting.       Abdominal distention  Musculoskeletal: Positive for falls.  Neurological: Positive for weakness.   Blood pressure 92/62, pulse 99, temperature 98.3 F (36.8 C), temperature source Oral, resp. rate 16, height 5' 9"  (1.753 m), weight 98.2 kg (216 lb 8 oz), SpO2 100 %. Physical Exam  Constitutional: He is oriented to person, place, and time. No distress.  Emaciated . Sunken eyes with prominent Zygomatic bone  Neck: JVD present.  Cardiovascular: Normal rate and regular rhythm.   Respiratory: No respiratory distress. He has no wheezes.  GI: He exhibits distension. There is no tenderness. There is no rebound.  Musculoskeletal: He exhibits edema.  Neurological: He is alert and oriented to person, place, and time.    Assessment/Plan: Problem #1 renal failure: At this moment for sure whether this is acute or chronic since there is no previous history or blood work to compare with. If this is acute this could be secondary to prerenal syndrome/ATN. However diabetes and hypertension may play some role. Problem #2 liver cirrhosis: Most likely secondary to  alcohol abuse Problem #3 hypertension: His blood pressure seems to be reasonably controlled Problem #4 history of diabetes Problem #5 pleural effusion: Right greater than left Problem #6  hyponatremia: Hypervolemic  hyponatremia Problem #7 low CO2 possibly metabolic acidosis however this could be secondary to respiratory alkalosis. Problem #8 proteinuria: Seems to be non-nephrotic range Plan: We'll check ANA, complement, hepatitis B surface antigen, hepatitis C antibody and ASO titer 2] will check 24-hour urine protein and immunoelectrophoresis 3] will check urine sodium/creatinine 4] will use albumin 25 g IV twice a day 5] will use Lasix 80 mg IV twice a day 6] will check his renal panel in the morning.  Raiquan Chandler S 07/18/2017, 3:59 PM

## 2017-07-18 NOTE — Care Management (Signed)
CM attempted to see pt to assess for needs at DC. Pt has no insurance and no PCP. Will need medications and f/u at DC.

## 2017-07-18 NOTE — Progress Notes (Addendum)
Initial Nutrition Assessment  DOCUMENTATION CODES:  Severe malnutrition in context of chronic illness  INTERVENTION:  Ensure Enlive po BID, each supplement provides 350 kcal and 20 grams of protein  Snacks in between meals as per nutritional support guidelines in cirrhosis.   Recommend a renal-appropriate MVI per MD discretion.  More specifically, Would recommend Vitamin D, thiamin, folate supplementation   NUTRITION DIAGNOSIS:  Malnutrition (Severe in Chronic context) related to poor appetite, early satiety, chronic illness (alcoholism progressing to cirrhosis) as evidenced by severe depletion of muscle and fat mass.  GOAL:  Patient will meet greater than or equal to 90% of their needs  MONITOR:  PO intake, Diet advancement, Supplement acceptance, Labs, Weight trends, I & O's  REASON FOR ASSESSMENT:  Consult Assessment of nutrition requirement/status  ASSESSMENT:  56 y/o male PMHx etoh abuse (says stopped 4 months ago), HTN, DM. Presented with 3 months of abdominal distension and weakness. CT reveals cirrhosis and massive ascites. Also revealed to have AKI. Admitted for management.   Pt has had Paracentesis of 4 L since admitted. Likely will undergo further fluid draws.   Pt today states that he has been eating snacks or "finger foods" for the last 2-3 months. He at one point followed a diabetic diet, but stopped because he didn't see it having an impact on him. He would occasionally take a centrum MVI for men 50+.  He denies any n/v/d. He has some milld constipation.   He says his UBW used to be 178 lbs. No objective weight history available. Pt has essentially no past chart history as he has rarely sought any medical care. Bed weight today was 218 lbs.   At this time, he says his appetite is fairly good. He said he ate more at lunch then he had in a while.   We discuss how the metabolism of cirrhotics is altered and he needs to eat frequently to prevent going into a  starvation state. He was agreeable to protein-containing snacks in between meals. He was agreeable to supplement. Will order.   Given level of ascites/edema, using IBW for dosing wt  NFPE: Severe muscle/fat wasting of head and upper extremities. Lower extremities edematousAbdomen markedly distended  Labs: Albumin: 1.6, Na: 133, K:4.5, bun/creat:41/2.86, A1C: 4.5 Meds: Insulin, Aldactone, Lasix, IV abx   Recent Labs Lab 07/16/17 1555 07/17/17 0624 07/18/17 0430  NA 129* 131* 133*  K 4.2 4.3 4.5  CL 101 103 107  CO2 18* 19* 19*  BUN 30* 33* 41*  CREATININE 2.39* 2.44* 2.86*  CALCIUM 8.4* 8.7* 8.4*  GLUCOSE 136* 94 105*   Diet Order:  Diet Heart Room service appropriate? Yes; Fluid consistency: Thin  Skin:  Reviewed, no issues  Last BM:  8/26   Height:  Ht Readings from Last 1 Encounters:  07/16/17 5\' 9"  (1.753 m)   Weight:  Wt Readings from Last 1 Encounters:  07/16/17 216 lb 8 oz (98.2 kg)   Wt Readings from Last 10 Encounters:  07/16/17 216 lb 8 oz (98.2 kg)  02/11/12 210 lb (95.3 kg)   Ideal Body Weight:  72.73 kg  BMI:  Body mass index is 31.97 kg/m.  Given level of ascites/edema, using IBW for dosing wt Estimated Nutritional Needs:  Kcal:  2250-2450 kcals (HBE x1.5 +/- 100 kcals) Protein:  100-116 g Pro (1.4-1.6 g/kg ibw) Fluid:  Per MD  EDUCATION NEEDS:  Education needs no appropriate at this time  Christophe Louis RD, LDN, CNSC Clinical Nutrition Pager: [phone] 07/18/2017  1:38 PM

## 2017-07-18 NOTE — Progress Notes (Signed)
PROGRESS NOTE    Shawn [name]  URK:[id number] DOB: 03-17-1961 DOA: 07/16/2017 PCP: Patient, No Pcp Per    Brief Narrative:  56 -year-old woman with a history of previous alcohol abuse, quit drinking 4 months ago, presents to the hospital with generalized weakness and abdominal distention. Found to have significant ascites and general anasarca with volume overload. He was admitted for further evaluation.   Assessment & Plan:   Active Problems:   Ascites due to alcoholic cirrhosis (HCC)   Thrombocytopenia (HCC)   Pleural effusion, left   SBP (spontaneous bacterial peritonitis) (HCC)   Hypoalbuminemia   Severe malnutrition (HCC)   AKI (acute kidney injury) ([place])   Cirrhosis of liver with ascites (HCC)   Protein-calorie malnutrition, severe   1. Decompensated alcoholic cirrhosis. Patient reports he stopped drinking approximately 4 months ago. Patient underwent paracentesis 8/27 with removal of 4.2 L of fluid. He still appears to be grossly volume overloaded with persistent ascites and evidence of pitting edema in the lower extremity/sacrum. Patient has been started on IV Lasix as well as Aldactone with minimal output. Nephrology consulted to assist with diuresis. He started on albumin infusions. Gastroenterology also following. Will likely need repeat paracentesis by the end of the week. 2. Possible spontaneous bacterial peritonitis. Peritoneal fluid analysis indicates elevated WBC count 1459. He is empirically started on Rocephin. Follow-up culture 3. Thrombocytopenia related to cirrhosis. No evidence of bleeding at this time. Continue to monitor 4. Severe protein calorie malnutrition. Nutrition following . He is cachectic and has temporal wasting. Labs indicate albumin/protein disassociation. SPEP/UPEP has been ordered to evaluate for multiple myeloma. HIV negative 5. Acute kidney injury. Elevated creatinine on admission. Unclear if he has a component of chronic kidney disease since no  prior records are available. He has not seen a primary care physician in many years. Kidneys appear normal size on imaging. Creatinine is rising with diuresis. Nephrology consulted.. 6. Left pleural effusion. Noted on chest x-ray. He does not appear significantly short of breath at this time. Continue diuresis and monitor.   DVT prophylaxis: SCDs Code Status: DO NOT RESUSCITATE Family Communication: No family present Disposition Plan: May need placement   Consultants:   Gastroenterology  Nephrology  Procedures:   8/27 Paracentesis with removal of 4.2 L of fluid  Antimicrobials:   Ceftriaxone 8/26>>   Subjective: Does not report much urine output is even though he is on IV Lasix. Abdomen still feels distended. Denies shortness of breath.  Objective: Vitals:   07/18/17 1045 07/18/17 1052 07/18/17 1102 07/18/17 1322  BP: 98/65 95/60 99/63  92/62  Pulse: 95 97 98 99  Resp:  18 17 16   Temp: 97.8 F (36.6 C) 98.4 F (36.9 C) 98.2 F (36.8 C) 98.3 F (36.8 C)  TempSrc: Oral Oral Oral Oral  SpO2: 100% 100% 100% 100%  Weight:      Height:        Intake/Output Summary (Last 24 hours) at 07/18/17 1826 Last data filed at 07/18/17 1516  Gross per 24 hour  Intake           362.67 ml  Output                0 ml  Net           362.67 ml   Filed Weights   07/16/17 2112  Weight: 98.2 kg (216 lb 8 oz)    Examination:  General exam: Appears calm and comfortable, cachetic with temporal wasting Respiratory system: Clear to  auscultation. Respiratory effort normal. Cardiovascular system: S1 & S2 heard, RRR. No JVD, murmurs, rubs, gallops or clicks. 2-3+ pedal edema to sacrum. Gastrointestinal system: Abdomen is distended, soft and nontender. No organomegaly or masses felt. Normal bowel sounds heard. Central nervous system: Alert and oriented. No focal neurological deficits. Extremities: Symmetric 5 x 5 power. Skin: No rashes, lesions or ulcers Psychiatry: Judgement and  insight appear normal. Mood & affect appropriate.     Data Reviewed: I have personally reviewed following labs and imaging studies  CBC:  Recent Labs Lab 07/16/17 1555 07/17/17 0624 07/18/17 0430  WBC 8.3 8.8 8.3  NEUTROABS 6.5  --   --   HGB 10.2* 10.3* 8.8*  HCT 29.4* 29.8* 25.7*  MCV 98.3 97.7 99.2  PLT 103* 95* 78*   Basic Metabolic Panel:  Recent Labs Lab 07/16/17 1555 07/17/17 0624 07/18/17 0430  NA 129* 131* 133*  K 4.2 4.3 4.5  CL 101 103 107  CO2 18* 19* 19*  GLUCOSE 136* 94 105*  BUN 30* 33* 41*  CREATININE 2.39* 2.44* 2.86*  CALCIUM 8.4* 8.7* 8.4*   GFR: Estimated Creatinine Clearance: 33.3 mL/min (A) (by C-G formula based on SCr of 2.86 mg/dL (H)). Liver Function Tests:  Recent Labs Lab 07/16/17 1555 07/17/17 0624 07/18/17 0430  AST 29 19 18   ALT 11* 10* 7*  ALKPHOS 51 47 46  BILITOT 2.9* 2.0* 1.3*  PROT 7.2 6.8 6.0*  ALBUMIN 1.8* 1.7* 1.6*   No results for input(s): LIPASE, AMYLASE in the last 168 hours. No results for input(s): AMMONIA in the last 168 hours. Coagulation Profile:  Recent Labs Lab 07/16/17 1555 07/18/17 1233  INR 1.97 1.99   Cardiac Enzymes:  Recent Labs Lab 07/16/17 1555  TROPONINI <0.03   BNP (last 3 results) No results for input(s): PROBNP in the last 8760 hours. HbA1C:  Recent Labs  07/17/17 0623  HGBA1C 4.5*   CBG:  Recent Labs Lab 07/17/17 1614 07/18/17 0028 07/18/17 0641 07/18/17 1159 07/18/17 1645  GLUCAP 73 110* 92 137* 111*   Lipid Profile: No results for input(s): CHOL, HDL, LDLCALC, TRIG, CHOLHDL, LDLDIRECT in the last 72 hours. Thyroid Function Tests: No results for input(s): TSH, T4TOTAL, FREET4, T3FREE, THYROIDAB in the last 72 hours. Anemia Panel: No results for input(s): VITAMINB12, FOLATE, FERRITIN, TIBC, IRON, RETICCTPCT in the last 72 hours. Sepsis Labs:  Recent Labs Lab 07/16/17 1555 07/16/17 1800  LATICACIDVEN 3.4* 3.3*    Recent Results (from the past 240  hour(s))  Urine culture     Status: Abnormal   Collection Time: 07/17/17 12:10 AM  Result Value Ref Range Status   Specimen Description URINE, CLEAN CATCH  Final   Special Requests NONE  Final   Culture MULTIPLE SPECIES PRESENT, SUGGEST RECOLLECTION (A)  Final   Report Status 07/18/2017 FINAL  Final  Gram stain     Status: None   Collection Time: 07/17/17  9:20 AM  Result Value Ref Range Status   Specimen Description ASCITIC  Final   Special Requests NONE  Final   Gram Stain   Final    CYTOSPIN SMEAR NO ORGANISMS SEEN WBC SEEN WBC PRESENT,BOTH PMN AND MONONUCLEAR    Report Status 07/17/2017 FINAL  Final  Culture, body fluid-bottle     Status: None (Preliminary result)   Collection Time: 07/17/17  9:20 AM  Result Value Ref Range Status   Specimen Description ASCITIC COLLECTED BY DOCTOR  Final   Special Requests BOTTLES DRAWN AEROBIC AND ANAEROBIC 10  CC EACH  Final   Culture NO GROWTH < 24 HOURS  Final   Report Status PENDING  Incomplete         Radiology Studies: US Paracentesis  Result Date: 07/17/2017 INDICATION: Ascites. EXAM: ULTRASOUND GUIDED RIGHT PARACENTESIS MEDICATIONS: None. COMPLICATIONS: None immediate. PROCEDURE: Informed written consent was obtained from the patient after a discussion of the risks, benefits and alternatives to treatment. A timeout was performed prior to the initiation of the procedure. Initial ultrasound scanning demonstrates a large amount of ascites within the right lower abdominal quadrant. The right lower abdomen was prepped and draped in the usual sterile fashion. 1% lidocaine with epinephrine was used for local anesthesia. Following this, a Yuen catheter was introduced. An ultrasound image was saved for documentation purposes. The paracentesis was performed. The catheter was removed and a dressing was applied. The patient tolerated the procedure well without immediate post procedural complication. FINDINGS: A total of approximately 4.2 L of  straw-colored fluid was removed. Samples were sent to the laboratory as requested by the clinical team. IMPRESSION: Successful ultrasound-guided paracentesis yielding 4.2 liters of peritoneal fluid. Electronically Signed   By: Lorriane Shire M.D.   On: 07/17/2017 10:05        Scheduled Meds: . feeding supplement (ENSURE ENLIVE)  237 mL Oral BID BM  . furosemide  80 mg Intravenous BID  . insulin aspart  0-9 Units Subcutaneous TID WC  . spironolactone  50 mg Oral Daily   Continuous Infusions: . sodium chloride 10 mL/hr at 07/16/17 2342  . albumin human    . cefTRIAXone (ROCEPHIN)  IV Stopped (07/17/17 2345)     LOS: 2 days    Time spent: 79mns    MEMON,JEHANZEB, MD [facility] Pager [phone] If 7PM-7AM, please contact night-coverage [email or web address] Password [email or web address]8/28/2018, 6:26 PM

## 2017-07-19 ENCOUNTER — Inpatient Hospital Stay (HOSPITAL_COMMUNITY): Payer: No Typology Code available for payment source

## 2017-07-19 DIAGNOSIS — K652 Spontaneous bacterial peritonitis: Secondary | ICD-10-CM

## 2017-07-19 LAB — PROTEIN ELECTROPHORESIS, SERUM
A/G Ratio: 0.5 — ABNORMAL LOW (ref 0.7–1.7)
ALPHA-1-GLOBULIN: 0.2 g/dL (ref 0.0–0.4)
ALPHA-2-GLOBULIN: 0.4 g/dL (ref 0.4–1.0)
Albumin ELP: 2.2 g/dL — ABNORMAL LOW (ref 2.9–4.4)
BETA GLOBULIN: 1.3 g/dL (ref 0.7–1.3)
GLOBULIN, TOTAL: 4.3 g/dL — AB (ref 2.2–3.9)
Gamma Globulin: 2.4 g/dL — ABNORMAL HIGH (ref 0.4–1.8)
Total Protein ELP: 6.5 g/dL (ref 6.0–8.5)

## 2017-07-19 LAB — CBC
HEMATOCRIT: 26.1 % — AB (ref 39.0–52.0)
HEMOGLOBIN: 9.2 g/dL — AB (ref 13.0–17.0)
MCH: 34.5 pg — AB (ref 26.0–34.0)
MCHC: 35.2 g/dL (ref 30.0–36.0)
MCV: 97.8 fL (ref 78.0–100.0)
Platelets: 71 10*3/uL — ABNORMAL LOW (ref 150–400)
RBC: 2.67 MIL/uL — ABNORMAL LOW (ref 4.22–5.81)
RDW: 15 % (ref 11.5–15.5)
WBC: 7 10*3/uL (ref 4.0–10.5)

## 2017-07-19 LAB — RENAL FUNCTION PANEL
ALBUMIN: 2.2 g/dL — AB (ref 3.5–5.0)
ANION GAP: 6 (ref 5–15)
BUN: 45 mg/dL — AB (ref 6–20)
CALCIUM: 8.7 mg/dL — AB (ref 8.9–10.3)
CO2: 24 mmol/L (ref 22–32)
Chloride: 104 mmol/L (ref 101–111)
Creatinine, Ser: 3.09 mg/dL — ABNORMAL HIGH (ref 0.61–1.24)
GFR calc Af Amer: 24 mL/min — ABNORMAL LOW (ref >60)
GFR calc non Af Amer: 21 mL/min — ABNORMAL LOW (ref >60)
GLUCOSE: 132 mg/dL — AB (ref 65–99)
Phosphorus: 4.3 mg/dL (ref 2.5–4.6)
Potassium: 4.3 mmol/L (ref 3.5–5.1)
SODIUM: 134 mmol/L — AB (ref 135–145)

## 2017-07-19 LAB — URINALYSIS, ROUTINE W REFLEX MICROSCOPIC
Bilirubin Urine: NEGATIVE
Glucose, UA: NEGATIVE mg/dL
Ketones, ur: NEGATIVE mg/dL
Nitrite: NEGATIVE
Protein, ur: NEGATIVE mg/dL
Specific Gravity, Urine: 1.014 (ref 1.005–1.030)
pH: 5 (ref 5.0–8.0)

## 2017-07-19 LAB — GLUCOSE, CAPILLARY
Glucose-Capillary: 128 mg/dL — ABNORMAL HIGH (ref 65–99)
Glucose-Capillary: 147 mg/dL — ABNORMAL HIGH (ref 65–99)

## 2017-07-19 LAB — IRON AND TIBC: Iron: 21 ug/dL — ABNORMAL LOW (ref 45–182)

## 2017-07-19 LAB — GRAM STAIN

## 2017-07-19 LAB — BODY FLUID CELL COUNT WITH DIFFERENTIAL
EOS FL: 0 %
Lymphs, Fluid: 6 %
Monocyte-Macrophage-Serous Fluid: 5 % — ABNORMAL LOW (ref 50–90)
NEUTROPHIL FLUID: 89 % — AB (ref 0–25)
WBC FLUID: 592 uL (ref 0–1000)

## 2017-07-19 LAB — HEPATITIS C ANTIBODY: HCV AB: 0.2 {s_co_ratio} (ref 0.0–0.9)

## 2017-07-19 LAB — C3 COMPLEMENT: C3 COMPLEMENT: 50 mg/dL — AB (ref 82–167)

## 2017-07-19 LAB — PROTEIN, URINE, 24 HOUR
COLLECTION INTERVAL-UPROT: 24 h
PROTEIN, URINE: 42 mg/dL
Protein, 24H Urine: 105 mg/d — ABNORMAL HIGH (ref 50–100)
URINE TOTAL VOLUME-UPROT: 250 mL

## 2017-07-19 LAB — RETICULOCYTES
RBC.: 2.65 MIL/uL — ABNORMAL LOW (ref 4.22–5.81)
Retic Count, Absolute: 50.4 10*3/uL (ref 19.0–186.0)
Retic Ct Pct: 1.9 % (ref 0.4–3.1)

## 2017-07-19 LAB — ANTISTREPTOLYSIN O TITER: ASO: 27 IU/mL (ref 0.0–200.0)

## 2017-07-19 LAB — HEPATITIS B SURFACE ANTIBODY,QUALITATIVE: HEP B S AB: REACTIVE

## 2017-07-19 LAB — C4 COMPLEMENT: Complement C4, Body Fluid: 8 mg/dL — ABNORMAL LOW (ref 14–44)

## 2017-07-19 LAB — HEPATITIS B SURFACE ANTIGEN: Hepatitis B Surface Ag: NEGATIVE

## 2017-07-19 LAB — MPO/PR-3 (ANCA) ANTIBODIES
ANCA Proteinase 3: 14.6 U/mL — ABNORMAL HIGH (ref 0.0–3.5)
Myeloperoxidase Abs: <9 U/mL (ref 0.0–9.0)

## 2017-07-19 LAB — FOLATE: Folate: 47.4 ng/mL (ref >5.9)

## 2017-07-19 LAB — FERRITIN: Ferritin: 478 ng/mL — ABNORMAL HIGH (ref 24–336)

## 2017-07-19 LAB — COMPLEMENT, TOTAL: Compl, Total (CH50): 25 U/mL (ref >41)

## 2017-07-19 LAB — VITAMIN B12: Vitamin B-12: 865 pg/mL (ref 180–914)

## 2017-07-19 LAB — ANTINUCLEAR ANTIBODIES, IFA: ANTINUCLEAR ANTIBODIES, IFA: NEGATIVE

## 2017-07-19 MED ORDER — SODIUM CHLORIDE 0.9 % IV SOLN
INTRAVENOUS | Status: DC
  Administered 2017-07-19 – 2017-07-21 (×3): via INTRAVENOUS

## 2017-07-19 NOTE — Procedures (Signed)
Uncomplicated paracentesis using US.  10L collected with stable BP.    JWatts MD

## 2017-07-19 NOTE — Progress Notes (Signed)
Subjective:  Patient's only complaint is that his bed is "killing by coccyx". "I don't want to take pain medication because by bed has a whole in it". Denies abdominal pain. Cannot lay flat due to sob. No sob if sitting upright. Abdomen is "tight again".   Objective: Vital signs in last 24 hours: Temp:  [97.8 F (36.6 C)-98.4 F (36.9 C)] 98.3 F (36.8 C) (08/29 0603) Pulse Rate:  [95-105] 105 (08/29 0603) Resp:  [16-18] 18 (08/29 0603) BP: (92-113)/(60-69) 113/69 (08/29 0603) SpO2:  [97 %-100 %] 97 % (08/29 0603) Last BM Date: 07/16/17 General:   Alert,  Well-developed, well-nourished, pleasant and cooperative in NAD. Appears frustrated.  Head:  Normocephalic and atraumatic. Eyes:  Sclera clear, no icterus.  Chest: CTA bilaterally without rales, rhonchi, crackles. Decreased breath sounds left base.  Abdomen:  tense, nontender, distended.  . Normal bowel sounds, without guarding, and without rebound.  Pitting edema in dependent portions.  Extremities:  Without clubbing, deformity. 2-3+ pitting edema to thighs.  Neurologic:  Alert and  oriented x4;  grossly normal neurologically. Skin:  Intact without significant lesions or rashes. Psych:  Alert and cooperative. Normal mood and affect.  Intake/Output from previous day: 08/28 0701 - 08/29 0700 In: 882.7 [P.O.:240; I.V.:342.7; IV Piggyback:300] Out: -  Intake/Output this shift: No intake/output data recorded.  Lab Results: CBC  Recent Labs  07/17/17 0624 07/18/17 0430 07/19/17 0635  WBC 8.8 8.3 7.0  HGB 10.3* 8.8* 9.2*  HCT 29.8* 25.7* 26.1*  MCV 97.7 99.2 97.8  PLT 95* 78* 71*   BMET  Recent Labs  07/17/17 0624 07/18/17 0430 07/19/17 0635  NA 131* 133* 134*  K 4.3 4.5 4.3  CL 103 107 104  CO2 19* 19* 24  GLUCOSE 94 105* 132*  BUN 33* 41* 45*  CREATININE 2.44* 2.86* 3.09*  CALCIUM 8.7* 8.4* 8.7*   LFTs  Recent Labs  07/16/17 1555 07/17/17 0624 07/18/17 0430 07/19/17 0635  BILITOT 2.9* 2.0* 1.3*  --    ALKPHOS 51 47 46  --   AST 29 19 18   --   ALT 11* 10* 7*  --   PROT 7.2 6.8 6.0*  --   ALBUMIN 1.8* 1.7* 1.6* 2.2*   No results for input(s): LIPASE in the last 72 hours. PT/INR  Recent Labs  07/16/17 1555 07/18/17 1233  LABPROT 22.8* 22.4*  INR 1.97 1.99   Hep B surface antigen: negative Hep B Surface Antibody: reactive HCV Ab: negative HIV: non-reactive    Imaging Studies: Ct Abdomen Pelvis Wo Contrast  Result Date: 07/16/2017 CLINICAL DATA:  Abdominal distention, weakness.  Heavy drinker. EXAM: CT ABDOMEN AND PELVIS WITHOUT CONTRAST TECHNIQUE: Multidetector CT imaging of the abdomen and pelvis was performed following the standard protocol without IV contrast. COMPARISON:  None. FINDINGS: Lower chest: Moderate left and small right pleural effusion. Patchy right lower lobe opacity, likely atelectasis, pneumonia not excluded. Patchy left lower lobe opacity, likely atelectasis. Hepatobiliary: Shrunken, nodular liver, reflecting a cirrhotic configuration. No focal hepatic lesion is seen. Gallbladder is contracted with multiple small gallstones (series 2/image 28). Pancreas: Within normal limits. Spleen: Normal in size. Adrenals/Urinary Tract: Adrenal glands are within normal limits. Kidneys are within normal limits.  No renal or ureteral calculi. Bladder is not discretely visualized. Stomach/Bowel: Stomach is within normal limits. Visualized bowel is grossly unremarkable. No evidence of bowel obstruction. Vascular/Lymphatic: No evidence of abdominal aortic aneurysm. Mild atherosclerotic calcifications. No gross abdominopelvic lymphadenopathy, although poorly evaluated. Reproductive: Prostate is unremarkable. Other:  Large volume abdominopelvic ascites. Musculoskeletal: Mild degenerative changes of the visualized thoracolumbar spine, most prominent at L4-5. IMPRESSION: Cirrhosis.  No focal hepatic lesion is seen. Large volume abdominopelvic ascites. Moderate left and small right pleural  effusions. Patchy bilateral lower lobe opacities, likely atelectasis, right lower lobe pneumonia not excluded. Electronically Signed   By: Julian Hy M.D.   On: 07/16/2017 18:17   Dg Chest 2 View  Result Date: 07/16/2017 CLINICAL DATA:  Cough and weakness EXAM: CHEST  2 VIEW COMPARISON:  02/05/2015 FINDINGS: Cardiac shadow is stable. The overall inspiratory effort is poor with bibasilar atelectatic changes. Left pleural effusion is noted similar to that seen on recent chest CT. No other focal abnormality is noted. IMPRESSION: Bibasilar atelectasis and left-sided pleural effusion. Electronically Signed   By: Inez Catalina M.D.   On: 07/16/2017 18:15   US Paracentesis  Result Date: 07/17/2017 INDICATION: Ascites. EXAM: ULTRASOUND GUIDED RIGHT PARACENTESIS MEDICATIONS: None. COMPLICATIONS: None immediate. PROCEDURE: Informed written consent was obtained from the patient after a discussion of the risks, benefits and alternatives to treatment. A timeout was performed prior to the initiation of the procedure. Initial ultrasound scanning demonstrates a large amount of ascites within the right lower abdominal quadrant. The right lower abdomen was prepped and draped in the usual sterile fashion. 1% lidocaine with epinephrine was used for local anesthesia. Following this, a Yuen catheter was introduced. An ultrasound image was saved for documentation purposes. The paracentesis was performed. The catheter was removed and a dressing was applied. The patient tolerated the procedure well without immediate post procedural complication. FINDINGS: A total of approximately 4.2 L of straw-colored fluid was removed. Samples were sent to the laboratory as requested by the clinical team. IMPRESSION: Successful ultrasound-guided paracentesis yielding 4.2 liters of peritoneal fluid. Electronically Signed   By: Lorriane Shire M.D.   On: 07/17/2017 10:05  [2 weeks]   Assessment: 56 year old male with decompensated  cirrhosis in setting of chronic ETOH abuse, last drink several months ago, with ascitic fluid concerning for SBP. Started on Rocephin IV, has received 3 doses thus far. Appears chronically ill, and renal function is worsening. Renal on board. Anemia noted but without overt GI bleeding, likely multifactorial. Discussed absolute ETOH cessation. Agree with DNR status. Overall poor prognosis. MELD 27 yesterday. Viral markers indicate immunity to hep b.   Anasarca with severe hypoalbuminemia. Receiving albumin, lasix. Plan for LVAP on Friday.   Multiple Myeloma work up in progress given albumin/protein disassociation.   Plan: 1. Continue Rocephin.  2. F/u pending fluid results.  3. Repeat paracentesis for therapeutic purposes Friday.  4. Air mattress.  5. Daily weights.  6. Will continue to follow with you.   Laureen Ochs. Bernarda Caffey [facility] [phone] 8/29/20189:37 AM     LOS: 3 days

## 2017-07-19 NOTE — Progress Notes (Signed)
Shawn JourdainDavid L [name]  MRN: [id number]  DOB/AGE: 56/06/1961 56 y.o.  Primary Care Physician:Patient, No Pcp Per  Admit date: 07/16/2017  Chief Complaint:  Chief Complaint  Patient presents with  . abdominal distension  . Weakness    S-Pt presented on  07/16/2017 with  Chief Complaint  Patient presents with  . abdominal distension  . Weakness  .    Pt offers no new complaints.      Pt says " I did not see any doctor for 20 years, I didn't see any thing wrong"     Meds . feeding supplement (ENSURE ENLIVE)  237 mL Oral BID BM  . furosemide  80 mg Intravenous BID  . insulin aspart  0-9 Units Subcutaneous TID WC  . spironolactone  50 mg Oral Daily       Physical Exam: Vital signs in last 24 hours: Temp:  [97.8 F (36.6 C)-98.4 F (36.9 C)] 98.3 F (36.8 C) (08/29 0603) Pulse Rate:  [95-105] 105 (08/29 0603) Resp:  [16-18] 18 (08/29 0603) BP: (92-113)/(60-69) 113/69 (08/29 0603) SpO2:  [97 %-100 %] 97 % (08/29 0603) Weight change:  Last BM Date: 07/16/17  Intake/Output from previous day: 08/28 0701 - 08/29 0700 In: 882.7 [P.O.:240; I.V.:342.7; IV Piggyback:300] Out: -  No intake/output data recorded.   Physical Exam: General- pt is awake,alert, oriented to time place and person Resp- No acute REsp distress, decreased at bases CVS- S1S2 regular in rate and rhythm GIT- BS+, soft, NT, ND EXT- 1+ LE Edema, Cyanosis   Lab Results: CBC  Recent Labs  07/18/17 0430 07/19/17 0635  WBC 8.3 7.0  HGB 8.8* 9.2*  HCT 25.7* 26.1*  PLT 78* 71*    BMET  Recent Labs  07/18/17 0430 07/19/17 0635  NA 133* 134*  K 4.5 4.3  CL 107 104  CO2 19* 24  GLUCOSE 105* 132*  BUN 41* 45*  CREATININE 2.86* 3.09*  CALCIUM 8.4* 8.7*    Creat trend 2018  2.4=>2.8=>3.09  MICRO Recent Results (from the past 240 hour(s))  Urine culture     Status: Abnormal   Collection Time: 07/17/17 12:10 AM  Result Value Ref Range Status   Specimen Description URINE, CLEAN CATCH  Final    Special Requests NONE  Final   Culture MULTIPLE SPECIES PRESENT, SUGGEST RECOLLECTION (A)  Final   Report Status 07/18/2017 FINAL  Final  Gram stain     Status: None   Collection Time: 07/17/17  9:20 AM  Result Value Ref Range Status   Specimen Description ASCITIC  Final   Special Requests NONE  Final   Gram Stain   Final    CYTOSPIN SMEAR NO ORGANISMS SEEN WBC SEEN WBC PRESENT,BOTH PMN AND MONONUCLEAR    Report Status 07/17/2017 FINAL  Final  Culture, body fluid-bottle     Status: None (Preliminary result)   Collection Time: 07/17/17  9:20 AM  Result Value Ref Range Status   Specimen Description ASCITIC COLLECTED BY DOCTOR  Final   Special Requests BOTTLES DRAWN AEROBIC AND ANAEROBIC 10 CC EACH  Final   Culture NO GROWTH 2 DAYS  Final   Report Status PENDING  Incomplete      Lab Results  Component Value Date   CALCIUM 8.7 (L) 07/19/2017   PHOS 4.3 07/19/2017       Auto immune data Complements low   Sec to Liver failure?        Impression: 1)Renal  AKI secondary to ATN  AKI on CKD ?               CKD stage not sure. not sure as not much data avaiable               CKD since not sure as not much data available                               2)HTN  Medication- On RAS blockers-Spironolactone On Diuretics-Lasix.  3)Anemia HGb low Primary team following  4)Liver-admitted with liver cirrhosis Sec to ETOH abuse   5)Resp-admitted with pleural effusion PMD following  6)Electrolytes  Normokalemic  Hyponatremic Hypervolemia Hyponatremia sec to cirrhosis  7)Acid base Co2 at goal     Plan:  Will ask for autoimmune work up Will repeat U/a Will ask for FE of sodium and  Urea as pt on diuretivs Will d/c spironolactone Will start IVf If creat not better after above -high suspicion for hepatorenal syndrome    I educated pt at length about his kidneys issues. Pt voiced understanding.        Dessiree Sze S 07/19/2017,  9:45 AM

## 2017-07-19 NOTE — Progress Notes (Signed)
  PROGRESS NOTE  Ardeen JourdainDavid L Dosher ZOX:[id number] DOB: 04/16/1961 DOA: 07/16/2017 PCP: Shawn [name], No Pcp Per  Brief Narrative: 56 year old man PMH alcohol abuse, presented with generalized weakness, abdominal distention. Admitted for massive ascites secondary to alcoholic cirrhosis.  Assessment/Plan Decompensated alcoholic cirrhosis with associated thrombocytopenia. Reportedly stopped drinking alcohol for months prior. Status post large volume paracentesis 8/27. -Continue Lasix per nephrology.  Acute kidney injury. No previous records available. -Nephrology following, plans autoimmune workup as well as other studies. Spironolactone discontinued. Concern for hepatorenal syndrome.  Possible SBP -Afebrile, no leukocytosis. Continue ceftriaxone. Follow-up culture data.  Diabetes mellitus type 2. -Stable.  Alcohol abuse in remission  Left pleural effusion. -Follow clinically.  Severe malnutrition   Continue present management. Prognosis guarded. Hopefully renal function will improve.  DVT prophylaxis: SCDs Code Status: DNR Family Communication: none Disposition Plan: home    Brendia Sacksaniel Jamell Laymon, MD  [facility] Direct contact: [phone] --Via amion app OR  --[email or web address]; password [email or web address]  7PM-7AM contact night coverage as above 07/19/2017, 5:47 PM  LOS: 3 days   Consultants:  Gastroenterology  Procedures:  Large volume paracentesis 8/27  Antimicrobials:  Ceftriaxone 8/26 >>  Interval history/Subjective: A little short of breath. No abdominal pain. Poor appetite.  Objective: Vitals: Afebrile, 98.2, 18, 94, 113/76, 100% on room air  Exam:     Constitutional: Appears chronically ill. Calm, comfortable.  Cardiovascular. Regular rate and rhythm. No murmur, rub or gallop. 2+ bilateral lower extremity edema.  Respiratory. Clear to auscultation bilaterally. No wheezes, rales or rhonchi. Normal respiratory effort.  Abdomen large, protuberant. Edematous.  Nontender.  Psychiatric. Grossly normal mood and affect. Speech fluent and appropriate.   I have personally reviewed the following:   Labs:  Blood sugars stable.  Potassium within normal limits. BUN trending up, 45. Creatinine trending up, 3.09.  WBC within normal limits. Hemoglobin stable, 9.2. PlateletsTrending down, 71.  Urinalysis with too numerous to count RBCs   Scheduled Meds: . feeding supplement (ENSURE ENLIVE)  237 mL Oral BID BM  . furosemide  80 mg Intravenous BID  . insulin aspart  0-9 Units Subcutaneous TID WC   Continuous Infusions: . sodium chloride 75 mL/hr at 07/19/17 1115  . albumin human    . cefTRIAXone (ROCEPHIN)  IV Stopped (07/18/17 2215)    Principal Problem:   Cirrhosis of liver with ascites (HCC) Active Problems:   Ascites due to alcoholic cirrhosis (HCC)   Thrombocytopenia (HCC)   Pleural effusion, left   SBP (spontaneous bacterial peritonitis) (HCC)   Hypoalbuminemia   Severe malnutrition (HCC)   AKI (acute kidney injury) (HCC)   Protein-calorie malnutrition, severe   LOS: 3 days

## 2017-07-19 NOTE — Care Management Note (Signed)
Case Management Note  Patient Details  Name: Shawn [name] MRN: [id number] Date of Birth: 05/01/1961  Subjective/Objective:                  Admitted with ascities, cirrhosis. Pt is from home, lives alone, has daughter who does not visit or call much. He has gf who visits 2x a week but who is chronically ill herself. He had liscence but does not drive because he does not feel it is safe. He does not answer how he gets where he needs to go. He was laid off from job and draws a small pension every month. He owns 2 homes, lives in one. He has been using crutches to ambulate at home since he started to swell and had difficulty walking. He is a [employer]ist[employer] but has no VA benefits. He has contacted them many times to inquire but is always turned down because he was never injured. He tried to apply for medicaid a year ago but was turned down also. He says GI told him he would qualify for disability at this time. Pt reports having no insurance but does have a bare-bones plan. He is allowed 3 PCP visits a year, pays first 5 days of hospitalization and does have drug coverage. Pt does not have PCP and has not been to one in decades. Pt aware he will need close follow up at DC.   Action/Plan: He plans to return home at DC. He was encouraged to initiate the disability process, says he has someone who could help him with that. CM asked FC to visit pt and discuss options and process for that. CM will attempt to establish PCP care for pt prior to DC but pt will also follow with GI and possibly oncology after DC. CM will cont to follow.   Expected Discharge Date:  07/17/17               Expected Discharge Plan:  Home/Self Care  In-House Referral:  NA  Discharge planning Services  CM Consult  Post Acute Care Choice:  NA Choice offered to:  NA  Status of Service:  In process, will continue to follow  Malcolm MetroChildress, Aleece Loyd Demske, RN 07/19/2017, 2:01 PM

## 2017-07-20 LAB — MPO/PR-3 (ANCA) ANTIBODIES
ANCA Proteinase 3: 15.1 U/mL — ABNORMAL HIGH (ref 0.0–3.5)
Myeloperoxidase Abs: <9 U/mL (ref 0.0–9.0)

## 2017-07-20 LAB — IMMUNOFIXATION ELECTROPHORESIS
IgA: 1342 mg/dL — ABNORMAL HIGH (ref 90–386)
IgG (Immunoglobin G), Serum: 2113 mg/dL — ABNORMAL HIGH (ref 700–1600)
IgM (Immunoglobulin M), Srm: 222 mg/dL — ABNORMAL HIGH (ref 20–172)
Total Protein ELP: 6.8 g/dL (ref 6.0–8.5)

## 2017-07-20 LAB — ANCA TITERS
Atypical P-ANCA titer: <1:20 {titer}
C-ANCA: <1:20 {titer}
P-ANCA: <1:20 {titer}

## 2017-07-20 LAB — BASIC METABOLIC PANEL
ANION GAP: 8 (ref 5–15)
BUN: 47 mg/dL — ABNORMAL HIGH (ref 6–20)
CALCIUM: 8.3 mg/dL — AB (ref 8.9–10.3)
CO2: 21 mmol/L — AB (ref 22–32)
CREATININE: 3.1 mg/dL — AB (ref 0.61–1.24)
Chloride: 105 mmol/L (ref 101–111)
GFR calc Af Amer: 24 mL/min — ABNORMAL LOW (ref >60)
GFR calc non Af Amer: 21 mL/min — ABNORMAL LOW (ref >60)
GLUCOSE: 125 mg/dL — AB (ref 65–99)
Potassium: 4.3 mmol/L (ref 3.5–5.1)
Sodium: 134 mmol/L — ABNORMAL LOW (ref 135–145)

## 2017-07-20 LAB — HEPATITIS PANEL, ACUTE
HCV Ab: 0.1 s/co ratio (ref 0.0–0.9)
Hep A IgM: NEGATIVE
Hep B C IgM: NEGATIVE
Hepatitis B Surface Ag: NEGATIVE

## 2017-07-20 LAB — UIFE/LIGHT CHAINS/TP QN, 24-HR UR
% BETA, URINE: 18.9 %
% BETA, Urine: 18.5 %
ALPHA 1 URINE: 1.6 %
ALPHA 1 URINE: 1.4 %
ALPHA 2 UR: 6.8 %
Albumin, U: 31 %
Albumin, U: 30.3 %
Alpha 2, Urine: 6.6 %
FREE KAPPA/LAMBDA RATIO: 13.34 — AB (ref 2.04–10.37)
FREE LAMBDA LT CHAINS, UR: 7.1 mg/L — AB (ref 0.24–6.66)
FREE LT CHN EXCR RATE: 94.7 mg/L — AB (ref 1.35–24.19)
Free Kappa/Lambda Ratio: 19.42 — ABNORMAL HIGH (ref 2.04–10.37)
Free Lambda Lt Chains,Ur: 6.18 mg/L (ref 0.24–6.66)
Free Lt Chn Excr Rate: 120 mg/L — ABNORMAL HIGH (ref 1.35–24.19)
GAMMA GLOBULIN URINE: 41.6 %
GAMMA GLOBULIN URINE: 43.2 %
TOTAL PROTEIN, URINE-UPE24: 47.3 mg/dL
Total Protein, Urine-Ur/day: 118 mg/24 hr (ref 30–150)
Total Protein, Urine-Ur/day: 116 mg/24 hr (ref 30–150)
Total Protein, Urine: 46.3 mg/dL
Total Volume: 250
Total Volume: 250

## 2017-07-20 LAB — GLUCOSE, CAPILLARY
GLUCOSE-CAPILLARY: 140 mg/dL — AB (ref 65–99)
Glucose-Capillary: 109 mg/dL — ABNORMAL HIGH (ref 65–99)
Glucose-Capillary: 127 mg/dL — ABNORMAL HIGH (ref 65–99)

## 2017-07-20 LAB — GLOMERULAR BASEMENT MEMBRANE ANTIBODIES: GBM Ab: 9 units (ref 0–20)

## 2017-07-20 LAB — PATHOLOGIST SMEAR REVIEW

## 2017-07-20 LAB — ANTI-DNA ANTIBODY, DOUBLE-STRANDED: ds DNA Ab: <1 IU/mL (ref 0–9)

## 2017-07-20 LAB — ANTINUCLEAR ANTIBODIES, IFA: ANA Ab, IFA: NEGATIVE

## 2017-07-20 MED ORDER — FUROSEMIDE 10 MG/ML IJ SOLN
120.0000 mg | Freq: Two times a day (BID) | INTRAMUSCULAR | Status: DC
  Administered 2017-07-20 – 2017-07-21 (×2): 120 mg via INTRAVENOUS
  Filled 2017-07-20 (×8): qty 12

## 2017-07-20 MED ORDER — SPIRONOLACTONE 25 MG PO TABS
50.0000 mg | ORAL_TABLET | Freq: Every day | ORAL | Status: DC
  Administered 2017-07-20: 50 mg via ORAL
  Filled 2017-07-20 (×2): qty 2

## 2017-07-20 NOTE — Progress Notes (Signed)
ANTIBIOTIC CONSULT NOTE  Pharmacy Consult for Ceftriaxone Indication: Spontaneous bacterial peritonitis  No Known Allergies  Patient Measurements: Height: 5\' 9"  (175.3 cm) Weight: 191 lb 9.6 oz (86.9 kg) IBW/kg (Calculated) : 70.7  Vital Signs: Temp: 98.2 F (36.8 C) (08/30 0456) Temp Source: Oral (08/30 0456) BP: 106/62 (08/30 0456) Pulse Rate: 98 (08/30 0456)  Labs:  Recent Labs  07/18/17 0430 07/19/17 0635 07/20/17 0442  WBC 8.3 7.0  --   HGB 8.8* 9.2*  --   PLT 78* 71*  --   CREATININE 2.86* 3.09* 3.10*   Estimated Creatinine Clearance: 29.1 mL/min (A) (by C-G formula based on SCr of 3.1 mg/dL (H)).  No results for input(s): VANCOTROUGH, VANCOPEAK, VANCORANDOM, GENTTROUGH, GENTPEAK, GENTRANDOM, TOBRATROUGH, TOBRAPEAK, TOBRARND, AMIKACINPEAK, AMIKACINTROU, AMIKACIN in the last 72 hours.   Microbiology: Recent Results (from the past 720 hour(s))  Urine culture     Status: Abnormal   Collection Time: 07/17/17 12:10 AM  Result Value Ref Range Status   Specimen Description URINE, CLEAN CATCH  Final   Special Requests NONE  Final   Culture MULTIPLE SPECIES PRESENT, SUGGEST RECOLLECTION (A)  Final   Report Status 07/18/2017 FINAL  Final  Gram stain     Status: None   Collection Time: 07/17/17  9:20 AM  Result Value Ref Range Status   Specimen Description ASCITIC  Final   Special Requests NONE  Final   Gram Stain   Final    CYTOSPIN SMEAR NO ORGANISMS SEEN WBC SEEN WBC PRESENT,BOTH PMN AND MONONUCLEAR    Report Status 07/17/2017 FINAL  Final  Culture, body fluid-bottle     Status: None (Preliminary result)   Collection Time: 07/17/17  9:20 AM  Result Value Ref Range Status   Specimen Description ASCITIC COLLECTED BY DOCTOR  Final   Special Requests BOTTLES DRAWN AEROBIC AND ANAEROBIC 10 CC EACH  Final   Culture NO GROWTH 3 DAYS  Final   Report Status PENDING  Incomplete  Gram stain     Status: None   Collection Time: 07/19/17  4:42 PM  Result Value Ref  Range Status   Specimen Description PERITONEAL CAVITY  Final   Special Requests NONE  Final   Gram Stain   Final    Performed at [facility] NO ORGANISMS SEEN WBC PRESENT,BOTH PMN AND MONONUCLEAR    Report Status 07/19/2017 FINAL  Final  Culture, body fluid-bottle     Status: None (Preliminary result)   Collection Time: 07/19/17  4:42 PM  Result Value Ref Range Status   Specimen Description ASCITIC COLLECTED BY DOCTOR  Final   Special Requests BOTTLES DRAWN AEROBIC AND ANAEROBIC 10 CC EACH  Final   Culture NO GROWTH < 24 HOURS  Final   Report Status PENDING  Incomplete    Medical History: Past Medical History:  Diagnosis Date  . Alcohol abuse   . Diabetes mellitus without complication (HCC)   . Gout   . Hypertension    Medications Prior to Admission  Medication Sig Dispense Refill  . Multiple Vitamins-Minerals (MULTIVITAMIN ADULTS 50+) TABS Take 1 tablet by mouth daily.     Assessment: 56 yo male with hx of alcohol abuse seen in the ED for complaints of weakness and abdominal distension. CT showed cirrhosis and large abdominopelvic ascites. Pt also has pleural effusions. Pharmacy has been consulted for ceftriaxone dosing. Cultures are ngtd  Goal of Therapy:  Eradication of infection  Plan: Continue Rocephin 2gm IV q24hrs Monitor labs, progress, c/s  Jersee Winiarski,  Hardeep Reetz Poteet, RPH 07/20/2017,8:42 AM

## 2017-07-20 NOTE — Progress Notes (Signed)
Subjective:  Patient reports that his abdomen is feeling better today. Much less distended. Change in a mattress has seemed to help his coccyx pain. He notices that his "abdomen jumps" when he exhales. Feels short of breath when he lays flat.  Objective: Vital signs in last 24 hours: Temp:  [98 F (36.7 C)-98.2 F (36.8 C)] 98.2 F (36.8 C) (08/30 0456) Pulse Rate:  [88-98] 98 (08/30 0456) Resp:  [18-20] 20 (08/30 0456) BP: (106-120)/(62-76) 106/62 (08/30 0456) SpO2:  [100 %] 100 % (08/30 0456) Weight:  [191 lb 9.6 oz (86.9 kg)] 191 lb 9.6 oz (86.9 kg) (08/30 0456) Last BM Date: 07/19/17 General:   Cachectic thin male in no acute distress, pleasant and cooperative. Head:  Normocephalic and atraumatic. Eyes:  Sclera clear, no icterus.  Chest: CTA bilaterally without rales, rhonchi, crackles.   Diminished breath sounds in the bases Heart:  Regular rate and rhythm; no murmurs, clicks, rubs,  or gallops. Abdomen:  Soft, distended but soft, nontender.  Normal bowel sounds, without guarding, and without rebound.  Pitting edema in the dependent portions of the abdomen Extremities:  Without clubbing, deformity. 2-3+ pitting edema to the thighs. Neurologic:  Alert and  oriented x4;  grossly normal neurologically. Skin:  Intact without significant lesions or rashes. Psych:  Alert and cooperative. Normal mood and affect.  Intake/Output from previous day: 08/29 0701 - 08/30 0700 In: 1281.3 [I.V.:1031.3; IV Piggyback:250] Out: 475 [Urine:475] Intake/Output this shift: Total I/O In: -  Out: 200 [Urine:200]  Lab Results: CBC  Recent Labs  07/18/17 0430 07/19/17 0635  WBC 8.3 7.0  HGB 8.8* 9.2*  HCT 25.7* 26.1*  MCV 99.2 97.8  PLT 78* 71*   BMET  Recent Labs  07/18/17 0430 07/19/17 0635 07/20/17 0442  NA 133* 134* 134*  K 4.5 4.3 4.3  CL 107 104 105  CO2 19* 24 21*  GLUCOSE 105* 132* 125*  BUN 41* 45* 47*  CREATININE 2.86* 3.09* 3.10*  CALCIUM 8.4* 8.7* 8.3*    LFTs  Recent Labs  07/18/17 0430 07/19/17 0635  BILITOT 1.3*  --   ALKPHOS 46  --   AST 18  --   ALT 7*  --   PROT 6.0*  --   ALBUMIN 1.6* 2.2*   No results for input(s): LIPASE in the last 72 hours. PT/INR  Recent Labs  07/18/17 1233  LABPROT 22.4*  INR 1.99      Imaging Studies: Ct Abdomen Pelvis Wo Contrast  Result Date: 07/16/2017 CLINICAL DATA:  Abdominal distention, weakness.  Heavy drinker. EXAM: CT ABDOMEN AND PELVIS WITHOUT CONTRAST TECHNIQUE: Multidetector CT imaging of the abdomen and pelvis was performed following the standard protocol without IV contrast. COMPARISON:  None. FINDINGS: Lower chest: Moderate left and small right pleural effusion. Patchy right lower lobe opacity, likely atelectasis, pneumonia not excluded. Patchy left lower lobe opacity, likely atelectasis. Hepatobiliary: Shrunken, nodular liver, reflecting a cirrhotic configuration. No focal hepatic lesion is seen. Gallbladder is contracted with multiple small gallstones (series 2/image 28). Pancreas: Within normal limits. Spleen: Normal in size. Adrenals/Urinary Tract: Adrenal glands are within normal limits. Kidneys are within normal limits.  No renal or ureteral calculi. Bladder is not discretely visualized. Stomach/Bowel: Stomach is within normal limits. Visualized bowel is grossly unremarkable. No evidence of bowel obstruction. Vascular/Lymphatic: No evidence of abdominal aortic aneurysm. Mild atherosclerotic calcifications. No gross abdominopelvic lymphadenopathy, although poorly evaluated. Reproductive: Prostate is unremarkable. Other: Large volume abdominopelvic ascites. Musculoskeletal: Mild degenerative changes of the visualized thoracolumbar  spine, most prominent at L4-5. IMPRESSION: Cirrhosis.  No focal hepatic lesion is seen. Large volume abdominopelvic ascites. Moderate left and small right pleural effusions. Patchy bilateral lower lobe opacities, likely atelectasis, right lower lobe pneumonia  not excluded. Electronically Signed   By: Julian Hy M.D.   On: 07/16/2017 18:17   Dg Chest 2 View  Result Date: 07/16/2017 CLINICAL DATA:  Cough and weakness EXAM: CHEST  2 VIEW COMPARISON:  02/05/2015 FINDINGS: Cardiac shadow is stable. The overall inspiratory effort is poor with bibasilar atelectatic changes. Left pleural effusion is noted similar to that seen on recent chest CT. No other focal abnormality is noted. IMPRESSION: Bibasilar atelectasis and left-sided pleural effusion. Electronically Signed   By: Inez Catalina M.D.   On: 07/16/2017 18:15   US Paracentesis  Result Date: 07/19/2017 INDICATION: Symptomatic ascites with shortness of breath. Rapid reaccumulation after paracentesis yesterday. Request for additional drainage. Patient received IV albumin prior to the procedure. EXAM: ULTRASOUND GUIDED THERAPEUTIC PARACENTESIS MEDICATIONS: None. COMPLICATIONS: None immediate. PROCEDURE: Informed written consent was obtained from the patient after a discussion of the risks, benefits and alternatives to treatment. Patient was informed that he was at increased risk of bleeding due to liver dysfunction, elevated INR, and depressed platelets. He accepts this level of risk due to degree of symptoms. A timeout was performed prior to the initiation of the procedure. Initial ultrasound scanning demonstrates a massive amount of ascites within the left lower abdominal quadrant, with tense abdomen and shallow breathing. The left lower abdomen was prepped and draped in the usual sterile fashion. 1% lidocaine was used for local anesthesia. Following this, a 19 gauge, 7-cm, Yueh catheter was introduced. An ultrasound image was saved for documentation purposes. The paracentesis was performed. Due to patient's bleeding risk for repeat punctures a larger volume of drainage than before was needed to allow time for medical stabilization of the patient's ascites. Although there was additional ascites available for  withdrawal and the patient's blood pressure was stable, no additional drainage performed to ensure no adverse renal effects. The catheter was removed and a dressing was applied. The patient tolerated the procedure well without immediate post procedural complication. FINDINGS: A total of approximately 10 L of yellow ascitic fluid was removed. IMPRESSION: Successful ultrasound-guided paracentesis yielding 10 liters of peritoneal fluid. Electronically Signed   By: Monte Fantasia M.D.   On: 07/19/2017 17:36   US Paracentesis  Result Date: 07/17/2017 INDICATION: Ascites. EXAM: ULTRASOUND GUIDED RIGHT PARACENTESIS MEDICATIONS: None. COMPLICATIONS: None immediate. PROCEDURE: Informed written consent was obtained from the patient after a discussion of the risks, benefits and alternatives to treatment. A timeout was performed prior to the initiation of the procedure. Initial ultrasound scanning demonstrates a large amount of ascites within the right lower abdominal quadrant. The right lower abdomen was prepped and draped in the usual sterile fashion. 1% lidocaine with epinephrine was used for local anesthesia. Following this, a Yuen catheter was introduced. An ultrasound image was saved for documentation purposes. The paracentesis was performed. The catheter was removed and a dressing was applied. The patient tolerated the procedure well without immediate post procedural complication. FINDINGS: A total of approximately 4.2 L of straw-colored fluid was removed. Samples were sent to the laboratory as requested by the clinical team. IMPRESSION: Successful ultrasound-guided paracentesis yielding 4.2 liters of peritoneal fluid. Electronically Signed   By: Lorriane Shire M.D.   On: 07/17/2017 10:05  [2 weeks]   Assessment:  56 year old male with decompensated cirrhosis in setting of chronic  ETOH abuse, last drink several months ago, with ascitic fluid concerning for SBP. Started on Rocephin IV. Appears chronically ill,  and renal function is worsening. Renal on board. Anemia noted but without overt GI bleeding, likely multifactorial. Discussed absolute ETOH cessation. Agree with DNR status. Overall poor prognosis. MELD 27. Viral markers indicate immunity to hep b.   Anasarca with severe hypoalbuminemia. Receiving albumin, lasix. LVAP of 10 liters done yesterday afternoon. His cell count is improved, absolute PMN count down from 1298 to 526.     Multiple Myeloma work up in progress given albumin/protein disassociation.   Shortness of breath, when supine. Does not appear in distress. May need repeat cxr.   Plan: 1. Continue Rocephin 2. Follow-up pending results labs 3. Management of sob per attending.  4. We will continue to follow with you  Laureen Ochs. Bernarda Caffey [facility] [phone] 8/30/20189:26 AM     LOS: 4 days

## 2017-07-20 NOTE — Progress Notes (Signed)
  PROGRESS NOTE  Shawn JourdainDavid L [name] ZOX:[id number] DOB: 11/14/1961 DOA: 07/16/2017 PCP: Shawn [name], No Pcp Per  Brief Narrative: 56 year old man PMH alcohol abuse, presented with generalized weakness, abdominal distention. Admitted for massive ascites secondary to alcoholic cirrhosis.  Assessment/Plan Decompensated alcoholic cirrhosis with associated thrombocytopenia and massive volume overload. Reportedly stopped drinking alcohol for months prior. Status post large volume paracentesis 8/27 and 8/30. -Symptomatically better status post large volume paracentesis. Still has significant volume overload. Nephrology has increase Lasix and added Aldactone. Check BMP in the morning.  Acute kidney injury. No previous records available. -Renal function may be stabilizing. Management per nephrology as above.  Possible SBP -Remains afebrile. Culture data negative thus far. Continue ceftriaxone.  Diabetes mellitus type 2. -Stable.  Alcohol abuse in remission  Left pleural effusion. -Follow clinically.  Severe malnutrition   Continue present management, follow up BMP in the morning.  DVT prophylaxis: SCDs Code Status: DNR Family Communication: none Disposition Plan: home    [name], MD  [facility] Direct contact: [phone] --Via amion app OR  --[email or web address]; password [email or web address]  7PM-7AM contact night coverage as above 07/20/2017, 2:43 PM  LOS: 4 days   Consultants:  Gastroenterology  Nephrology  Procedures:  Large volume paracentesis 8/27  Large volume paracentesis 8/30  Antimicrobials:  Ceftriaxone 8/26 >>  Interval history/Subjective: Feels a little better today. Still a little short of breath. Eating okay.  Objective: Vitals: Afebrile, 98.2, 20, 98, 106/62  Exam:     Constitutional. Appears calm, comfortable. Appears chronically ill.  Respiratory. Clear to auscultation bilaterally. No wheezes, rales or rhonchi. Normal respiratory  effort.  Cardiovascular. Regular rate and rhythm. No murmur, rub or gallop. No change in significant 2-3 plus bilateral lower extremity edema.  Abdomen. Massively distended. Nontender.  Psychiatric. Appears depressed. Affect appears normal. Speech fluent and clear.   I have personally reviewed the following:  Urine output incompletely recorded Labs:  Blood sugars stable.  Creatinine without significant change, 3.10. BUN without significant change. Potassium normal, 4.3. Remainder BMP unremarkable.  Cell count of peritoneal fluid has decreased. WBC 592.   Scheduled Meds: . feeding supplement (ENSURE ENLIVE)  237 mL Oral BID BM  . insulin aspart  0-9 Units Subcutaneous TID WC  . spironolactone  50 mg Oral Daily   Continuous Infusions: . sodium chloride 75 mL/hr at 07/19/17 1817  . albumin human    . cefTRIAXone (ROCEPHIN)  IV Stopped (07/19/17 2258)  . furosemide      Principal Problem:   Cirrhosis of liver with ascites (HCC) Active Problems:   Ascites due to alcoholic cirrhosis (HCC)   Thrombocytopenia (HCC)   Pleural effusion, left   SBP (spontaneous bacterial peritonitis) (HCC)   Hypoalbuminemia   Severe malnutrition (HCC)   AKI (acute kidney injury) (HCC)   Protein-calorie malnutrition, severe   LOS: 4 days

## 2017-07-20 NOTE — Progress Notes (Signed)
Subjective: Interval History: has no complaint of nausea or vomiting. His appetite is getting better. Patient also denies any difficulty breathing..  Objective: Vital signs in last 24 hours: Temp:  [98 F (36.7 C)-98.2 F (36.8 C)] 98.2 F (36.8 C) (08/30 0456) Pulse Rate:  [88-98] 98 (08/30 0456) Resp:  [18-20] 20 (08/30 0456) BP: (106-120)/(62-76) 106/62 (08/30 0456) SpO2:  [100 %] 100 % (08/30 0456) Weight:  [86.9 kg (191 lb 9.6 oz)] 86.9 kg (191 lb 9.6 oz) (08/30 0456) Weight change:   Intake/Output from previous day: 08/29 0701 - 08/30 0700 In: 1281.3 [I.V.:1031.3; IV Piggyback:250] Out: 475 [Urine:475] Intake/Output this shift: Total I/O In: -  Out: 200 [Urine:200]  General appearance: alert, cooperative and no distress Resp: diminished breath sounds bilaterally Cardio: regular rate and rhythm GI: Distended, nontender Extremities: edema 2+ edema bilaterally  Lab Results:  Recent Labs  07/18/17 0430 07/19/17 0635  WBC 8.3 7.0  HGB 8.8* 9.2*  HCT 25.7* 26.1*  PLT 78* 71*   BMET:  Recent Labs  07/19/17 0635 07/20/17 0442  NA 134* 134*  K 4.3 4.3  CL 104 105  CO2 24 21*  GLUCOSE 132* 125*  BUN 45* 47*  CREATININE 3.09* 3.10*  CALCIUM 8.7* 8.3*   No results for input(s): PTH in the last 72 hours. Iron Studies:  Recent Labs  07/19/17 1149  IRON 21*  TIBC NOT CALCULATED  FERRITIN 478*    Studies/Results: [place] Paracentesis  Result Date: 07/19/2017 INDICATION: Symptomatic ascites with shortness of breath. Rapid reaccumulation after paracentesis yesterday. Request for additional drainage. Patient received IV albumin prior to the procedure. EXAM: ULTRASOUND GUIDED THERAPEUTIC PARACENTESIS MEDICATIONS: None. COMPLICATIONS: None immediate. PROCEDURE: Informed written consent was obtained from the patient after a discussion of the risks, benefits and alternatives to treatment. Patient was informed that he was at increased risk of bleeding due to liver  dysfunction, elevated INR, and depressed platelets. He accepts this level of risk due to degree of symptoms. A timeout was performed prior to the initiation of the procedure. Initial ultrasound scanning demonstrates a massive amount of ascites within the left lower abdominal quadrant, with tense abdomen and shallow breathing. The left lower abdomen was prepped and draped in the usual sterile fashion. 1% lidocaine was used for local anesthesia. Following this, a 19 gauge, 7-cm, Yueh catheter was introduced. An ultrasound image was saved for documentation purposes. The paracentesis was performed. Due to patient's bleeding risk for repeat punctures a larger volume of drainage than before was needed to allow time for medical stabilization of the patient's ascites. Although there was additional ascites available for withdrawal and the patient's blood pressure was stable, no additional drainage performed to ensure no adverse renal effects. The catheter was removed and a dressing was applied. The patient tolerated the procedure well without immediate post procedural complication. FINDINGS: A total of approximately 10 L of yellow ascitic fluid was removed. IMPRESSION: Successful ultrasound-guided paracentesis yielding 10 liters of peritoneal fluid. Electronically Signed   By: Marnee SpringJonathon  Watts M.D.   On: 07/19/2017 17:36    I have reviewed the patient's current medications.  Assessment/Plan: Problem #1 renal failure: Acute versus chronic. Presently his renal function remains stable. His urine output is slightly better. Patient presently denies any nausea or vomiting. His potassium is normal Problem #2 history of liver cirrhosis: Patient with significant anasarca. His status post paracentesis Problem #3 anemia: His hemoglobin is below target goal. At this moment seems to be anemia of chronic disease including chronic  renal failure. Problem #4 hypertension: His blood pressure is reasonably controlled Problem #5  history of diabetes Problem #6 bone and mineral disorder: His calcium and phosphorus is in range. Plan: 1] We'll increase his Lasix to 120 mg IV twice a day  2] will start patient on Aldactone 50 mg once a day 3] will check his renal panel and CBC in the morning    LOS: 4 days   Shawn [name] S 07/20/2017,9:04 AM

## 2017-07-21 ENCOUNTER — Encounter (HOSPITAL_COMMUNITY): Payer: Self-pay | Admitting: Cardiology

## 2017-07-21 ENCOUNTER — Inpatient Hospital Stay (HOSPITAL_COMMUNITY): Payer: No Typology Code available for payment source

## 2017-07-21 DIAGNOSIS — F1011 Alcohol abuse, in remission: Secondary | ICD-10-CM

## 2017-07-21 DIAGNOSIS — Z87898 Personal history of other specified conditions: Secondary | ICD-10-CM

## 2017-07-21 DIAGNOSIS — E43 Unspecified severe protein-calorie malnutrition: Secondary | ICD-10-CM

## 2017-07-21 DIAGNOSIS — E871 Hypo-osmolality and hyponatremia: Secondary | ICD-10-CM

## 2017-07-21 DIAGNOSIS — D696 Thrombocytopenia, unspecified: Secondary | ICD-10-CM

## 2017-07-21 DIAGNOSIS — E8809 Other disorders of plasma-protein metabolism, not elsewhere classified: Secondary | ICD-10-CM

## 2017-07-21 LAB — RENAL FUNCTION PANEL
Albumin: 2.5 g/dL — ABNORMAL LOW (ref 3.5–5.0)
Anion gap: 8 (ref 5–15)
BUN: 55 mg/dL — ABNORMAL HIGH (ref 6–20)
CO2: 20 mmol/L — ABNORMAL LOW (ref 22–32)
Calcium: 8.3 mg/dL — ABNORMAL LOW (ref 8.9–10.3)
Chloride: 106 mmol/L (ref 101–111)
Creatinine, Ser: 3.15 mg/dL — ABNORMAL HIGH (ref 0.61–1.24)
GFR calc Af Amer: 24 mL/min — ABNORMAL LOW (ref >60)
GFR calc non Af Amer: 21 mL/min — ABNORMAL LOW (ref >60)
Glucose, Bld: 132 mg/dL — ABNORMAL HIGH (ref 65–99)
Phosphorus: 4 mg/dL (ref 2.5–4.6)
Potassium: 4.4 mmol/L (ref 3.5–5.1)
Sodium: 134 mmol/L — ABNORMAL LOW (ref 135–145)

## 2017-07-21 LAB — PROTEIN ELECTROPHORESIS, SERUM
A/G Ratio: 0.7 (ref 0.7–1.7)
Albumin ELP: 2.8 g/dL — ABNORMAL LOW (ref 2.9–4.4)
Alpha-1-Globulin: 0.2 g/dL (ref 0.0–0.4)
Alpha-2-Globulin: 0.3 g/dL — ABNORMAL LOW (ref 0.4–1.0)
Beta Globulin: 0.7 g/dL (ref 0.7–1.3)
Gamma Globulin: 2.7 g/dL — ABNORMAL HIGH (ref 0.4–1.8)
Globulin, Total: 3.9 g/dL (ref 2.2–3.9)
Total Protein ELP: 6.7 g/dL (ref 6.0–8.5)

## 2017-07-21 LAB — CBC
HCT: 25.2 % — ABNORMAL LOW (ref 39.0–52.0)
Hemoglobin: 8.9 g/dL — ABNORMAL LOW (ref 13.0–17.0)
MCH: 34.2 pg — ABNORMAL HIGH (ref 26.0–34.0)
MCHC: 35.3 g/dL (ref 30.0–36.0)
MCV: 96.9 fL (ref 78.0–100.0)
Platelets: 58 10*3/uL — ABNORMAL LOW (ref 150–400)
RBC: 2.6 MIL/uL — ABNORMAL LOW (ref 4.22–5.81)
RDW: 15.2 % (ref 11.5–15.5)
WBC: 7.8 10*3/uL (ref 4.0–10.5)

## 2017-07-21 LAB — GLUCOSE, CAPILLARY
GLUCOSE-CAPILLARY: 149 mg/dL — AB (ref 65–99)
GLUCOSE-CAPILLARY: 109 mg/dL — AB (ref 65–99)
Glucose-Capillary: 125 mg/dL — ABNORMAL HIGH (ref 65–99)
Glucose-Capillary: 136 mg/dL — ABNORMAL HIGH (ref 65–99)
Glucose-Capillary: 117 mg/dL — ABNORMAL HIGH (ref 65–99)

## 2017-07-21 MED ORDER — FUROSEMIDE 10 MG/ML IJ SOLN
160.0000 mg | Freq: Two times a day (BID) | INTRAVENOUS | Status: DC
  Administered 2017-07-21 – 2017-07-24 (×6): 160 mg via INTRAVENOUS
  Filled 2017-07-21 (×8): qty 16

## 2017-07-21 MED ORDER — ALBUMIN HUMAN 25 % IV SOLN
INTRAVENOUS | Status: AC
  Filled 2017-07-21: qty 200

## 2017-07-21 MED ORDER — ALBUMIN HUMAN 25 % IV SOLN
12.5000 g | Freq: Once | INTRAVENOUS | Status: AC
  Administered 2017-07-21: 75 g via INTRAVENOUS
  Filled 2017-07-21: qty 50

## 2017-07-21 MED ORDER — ALBUMIN HUMAN 25 % IV SOLN
INTRAVENOUS | Status: AC
  Filled 2017-07-21: qty 100

## 2017-07-21 MED ORDER — SODIUM CHLORIDE 0.9 % IV BOLUS (SEPSIS)
500.0000 mL | Freq: Once | INTRAVENOUS | Status: AC
  Administered 2017-07-21: 500 mL via INTRAVENOUS

## 2017-07-21 MED ORDER — SPIRONOLACTONE 25 MG PO TABS
100.0000 mg | ORAL_TABLET | Freq: Every day | ORAL | Status: DC
  Administered 2017-07-22 – 2017-07-24 (×3): 100 mg via ORAL
  Filled 2017-07-21 (×4): qty 4

## 2017-07-21 MED ORDER — TRAZODONE HCL 50 MG PO TABS
50.0000 mg | ORAL_TABLET | Freq: Every day | ORAL | Status: DC
  Administered 2017-07-21 – 2017-08-03 (×14): 50 mg via ORAL
  Filled 2017-07-21 (×14): qty 1

## 2017-07-21 MED ORDER — ENSURE ENLIVE PO LIQD
237.0000 mL | Freq: Three times a day (TID) | ORAL | Status: DC
  Administered 2017-07-21 – 2017-08-03 (×23): 237 mL via ORAL

## 2017-07-21 NOTE — Sedation Documentation (Signed)
Report given to Lauren , on 3rd floor.

## 2017-07-21 NOTE — Consult Note (Signed)
[facility] Consultation Oncology  Name: Shawn [name]      MRN: [id number]    Location: D664/Q034-74  Date: 07/21/2017 Time:10:48 AM   REFERRING PHYSICIAN:  Dr. Janeice Robinson [facility]  REASON FOR CONSULT:  Anemia, Thrombocytopenia, Polyclonal gammopathy   DIAGNOSIS:  Anemia, Thrombocytopenia, Polyclonal gammopathy  HISTORY OF PRESENT ILLNESS:  Shawn [name] 56 y.o. male presented to [facility] ED on 07/16/17 with complaints of persistent generalized weakness and abdominal distention.  Reportedly with history of heavy alcohol use and is documented that he quit ~4 months ago.  CT abd/pelvis revealed cirrhosis with large volume abdominopelvic ascites as well as bilateral pleural effusions.  Lactate >3 and he was admitted to hospital for further evaluation and management.   He has required several large volume paracenteses procedures since being admitted to the hospital.  Peritoneal fluid with reactive mesothelial cells and acute inflammation present on cytopathology.     Noted to have anemia with Hgb 8.9 g/dL today and platelets low at 58,000.      PAST MEDICAL HISTORY:   Past Medical History:  Diagnosis Date  . Alcohol abuse   . Diabetes mellitus without complication ([place])   . Gout   . Hypertension     ALLERGIES: No Known Allergies    MEDICATIONS: I have reviewed the patient's current medications.     PAST SURGICAL HISTORY Past Surgical History:  Procedure Laterality Date  . none      FAMILY HISTORY: Family History  Problem Relation Age of Onset  . Colon cancer Neg Hx   . Colon polyps Neg Hx     SOCIAL HISTORY:  reports that he has never smoked. He uses smokeless tobacco. He reports that he drinks alcohol. He reports that he does not use drugs.  PERFORMANCE STATUS:   PHYSICAL EXAM: Most Recent Vital Signs: Blood pressure 103/63, pulse 91, temperature 98.3 F (36.8 C), temperature source Oral, resp. rate 20, height 5' 9"  (1.753 m), weight 191 lb 9.6  oz (86.9 kg), SpO2 98 %.   LABORATORY DATA:  Results for orders placed or performed during the hospital encounter of 07/16/17 (from the past 48 hour(s))  IFE/Light Chains/TP Qn, 24-Hr Ur     Status: Abnormal   Collection Time: 07/19/17 11:09 AM  Result Value Ref Range   Total Protein, Urine 46.3 Not Estab. mg/dL   Total Protein, Urine-Ur/day 116 30 - 150 mg/24 hr   Albumin, U 30.3 %   ALPHA 1 URINE 1.4 %   Alpha 2, Urine 6.6 %   % BETA, Urine 18.5 %   GAMMA GLOBULIN URINE 43.2 %   Free Lt Chn Excr Rate 120.00 (H) 1.35 - 24.19 mg/L   Free Lambda Lt Chains,Ur 6.18 0.24 - 6.66 mg/L   Free Kappa/Lambda Ratio 19.42 (H) 2.04 - 10.37    Comment: (NOTE) Performed At: [facility] [place], [place] [id number] Lindon Romp MD [id number]    Immunofixation Result, Urine Comment     Comment: An apparent normal immunofixation pattern.   Total Volume 250    M-SPIKE %, Urine Not Observed Not Observed %   Note: Comment     Comment: (NOTE) Protein electrophoresis scan will follow via computer, mail, or courier delivery.   Urinalysis, Routine w reflex microscopic     Status: Abnormal   Collection Time: 07/19/17 11:11 AM  Result Value Ref Range   Color, Urine AMBER (A) YELLOW    Comment: BIOCHEMICALS MAY BE AFFECTED BY COLOR  APPearance HAZY (A) CLEAR   Specific Gravity, Urine 1.014 1.005 - 1.030   pH 5.0 5.0 - 8.0   Glucose, UA NEGATIVE NEGATIVE mg/dL   Hgb urine dipstick MODERATE (A) NEGATIVE   Bilirubin Urine NEGATIVE NEGATIVE   Ketones, ur NEGATIVE NEGATIVE mg/dL   Protein, ur NEGATIVE NEGATIVE mg/dL   Nitrite NEGATIVE NEGATIVE   Leukocytes, UA TRACE (A) NEGATIVE   RBC / HPF TOO NUMEROUS TO COUNT 0 - 5 RBC/hpf   WBC, UA 0-5 0 - 5 WBC/hpf   Bacteria, UA RARE (A) NONE SEEN   Squamous Epithelial / LPF 0-5 (A) NONE SEEN   Mucus PRESENT    Hyaline Casts, UA PRESENT   Glucose, capillary     Status: Abnormal   Collection Time: 07/19/17 11:44 AM  Result  Value Ref Range   Glucose-Capillary 147 (H) 65 - 99 mg/dL   Comment 1 Notify RN    Comment 2 Document in Chart   Glomerular basement membrane antibodies     Status: None   Collection Time: 07/19/17 11:49 AM  Result Value Ref Range   GBM Ab 9 0 - 20 units    Comment: (NOTE)                   Negative                   0 - 20                   Weak Positive             21 - 30                   Moderate to Strong Positive   >30 Performed At: [facility] [place], [place] [id number] Lindon Romp MD [id number]   Hepatitis panel, acute     Status: None   Collection Time: 07/19/17 11:49 AM  Result Value Ref Range   Hepatitis B Surface Ag Negative Negative   HCV Ab 0.1 0.0 - 0.9 s/co ratio    Comment: (NOTE)                                  Negative:     < 0.8                             Indeterminate: 0.8 - 0.9                                  Positive:     > 0.9 The CDC recommends that a positive HCV antibody result be followed up with a HCV Nucleic Acid Amplification test (701779). Performed At: [facility] [place], [place] [id number] Lindon Romp MD RA:[id number]    Hep A IgM Negative Negative   Hep B C IgM Negative Negative  Immunofixation electrophoresis     Status: Abnormal   Collection Time: 07/19/17 11:49 AM  Result Value Ref Range   Total Protein ELP 6.8 6.0 - 8.5 g/dL   IgG (Immunoglobin G), Serum 2,113 (H) 700 - 1,600 mg/dL   IgA 1,342 (H) 90 - 386 mg/dL    Comment: (NOTE) Results confirmed on dilution.    IgM (Immunoglobulin M), Srm 222 (H) 20 -  172 mg/dL    Comment: (NOTE) Performed At: [facility] [place], [place] [id number] Lindon Romp MD [id number]:[id number]    Immunofixation Result, Serum Comment     Comment: (NOTE) An apparent polyclonal gammopathy: IgG, IgA and IgM. Kappa and lambda typing appear increased.   ANA, IFA (with reflex)     Status: None   Collection Time:  07/19/17 11:49 AM  Result Value Ref Range   ANA Ab, IFA Negative     Comment: (NOTE)                                     Negative   <1:80                                     Borderline  1:80                                     Positive   >1:80 Performed At: [facility] [place], [place] [id number] Lindon Romp MD [id number]   ANCA Titers     Status: None   Collection Time: 07/19/17 11:49 AM  Result Value Ref Range   C-ANCA <1:20 Neg:<1:20 titer   P-ANCA <1:20 Neg:<1:20 titer    Comment: (NOTE) The presence of positive fluorescence exhibiting P-ANCA or C-ANCA patterns alone is not specific for the diagnosis of Wegener's Granulomatosis (WG) or microscopic polyangiitis. Decisions about treatment should not be based solely on ANCA IFA results.  The International ANCA Group Consensus recommends follow up testing of positive sera with both PR-3 and MPO-ANCA enzyme immunoassays. As many as 5% serum samples are positive only by EIA. Ref. AM J Clin Pathol 1999;111:507-513.    Atypical P-ANCA titer <1:20 Neg:<1:20 titer    Comment: (NOTE) The atypical pANCA pattern has been observed in a significant percentage of patients with ulcerative colitis, primary sclerosing cholangitis and autoimmune hepatitis. Performed At: [facility] [place], [place] [id number] Lindon Romp MD OQ:[id number]   Mpo/pr-3 (anca) antibodies     Status: Abnormal   Collection Time: 07/19/17 11:49 AM  Result Value Ref Range   Myeloperoxidase Abs <9.0 0.0 - 9.0 U/mL   ANCA Proteinase 3 15.1 (H) 0.0 - 3.5 U/mL    Comment: (NOTE) Performed At: [facility] [place], [place] [id number] Lindon Romp MD [id number]   Anti-DNA antibody, double-stranded     Status: None   Collection Time: 07/19/17 11:49 AM  Result Value Ref Range   ds DNA Ab <1 0 - 9 IU/mL    Comment: (NOTE)                                   Negative      <5                                    Equivocal  5 - 9  Positive      >9 Performed At: [facility] [place], [place] [id number] Lindon Romp MD [id number]   Vitamin B12     Status: None   Collection Time: 07/19/17 11:49 AM  Result Value Ref Range   Vitamin B-12 865 180 - 914 pg/mL    Comment: Performed at [facility], [place] [address], [place], [place] [address]  Folate     Status: None   Collection Time: 07/19/17 11:49 AM  Result Value Ref Range   Folate 47.4 >5.9 ng/mL    Comment: RESULTS CONFIRMED BY MANUAL DILUTION Performed at [facility], [place] [address] [place], [place] [address]   Iron and TIBC     Status: Abnormal   Collection Time: 07/19/17 11:49 AM  Result Value Ref Range   Iron 21 (L) 45 - 182 ug/dL   TIBC NOT CALCULATED 250 - 450 ug/dL    Comment: TRANSFERRIN IS <70   Saturation Ratios NOT CALCULATED 17.9 - 39.5 %    Comment: TRANSFERRIN IS <70   UIBC NOT CALCULATED ug/dL    Comment: TRANSFERRIN IS <70 Performed at [place] [address], [place], [place] [address]   Ferritin     Status: Abnormal   Collection Time: 07/19/17 11:49 AM  Result Value Ref Range   Ferritin 478 (H) 24 - 336 ng/mL    Comment: Performed at [facility], [place] [address], [place], [place] [address]  Reticulocytes     Status: Abnormal   Collection Time: 07/19/17 11:49 AM  Result Value Ref Range   Retic Ct Pct 1.9 0.4 - 3.1 %   RBC. 2.65 (L) 4.22 - 5.81 MIL/uL   Retic Count, Absolute 50.4 19.0 - 186.0 K/uL  Body fluid cell count with differential     Status: Abnormal   Collection Time: 07/19/17  4:42 PM  Result Value Ref Range   Fluid Type-FCT PERITONEAL CAVITY    Color, Fluid YELLOW (A) YELLOW   Appearance, Fluid CLOUDY (A) CLEAR   WBC, Fluid 592 0 - 1,000 cu mm   Neutrophil Count, Fluid 89 (H) 0 - 25 %   Lymphs, Fluid 6 %   Monocyte-Macrophage-Serous Fluid 5 (L) 50 - 90 %   Eos, Fluid 0 %  Gram  stain     Status: None   Collection Time: 07/19/17  4:42 PM  Result Value Ref Range   Specimen Description PERITONEAL CAVITY    Special Requests NONE    Gram Stain      Performed at [facility] WBC PRESENT,BOTH PMN AND MONONUCLEAR    Report Status 07/19/2017 FINAL   Pathologist smear review     Status: None   Collection Time: 07/19/17  4:42 PM  Result Value Ref Range   Path Review Reviewed By Violet Baldy, M.D.     Comment: 08.30.18 BLOODY SPECIMEN WITH ABUNDANT NEUTROPHILS, AND REACTIVE MESOTHELIAL CELLS. Performed at [facility], [place] [address] [place],  [address]   Culture, body fluid-bottle     Status: None (Preliminary result)   Collection Time: 07/19/17  4:42 PM  Result Value Ref Range   Specimen Description ASCITIC COLLECTED BY DOCTOR    Special Requests BOTTLES DRAWN AEROBIC AND ANAEROBIC 10 CC EACH    Culture NO GROWTH 2 DAYS    Report Status PENDING   Glucose, capillary     Status: Abnormal   Collection Time: 07/20/17 12:58  AM  Result Value Ref Range   Glucose-Capillary 127 (H) 65 - 99 mg/dL   Comment 1 Notify RN    Comment 2 Document in Chart   Basic metabolic panel     Status: Abnormal   Collection Time: 07/20/17  4:42 AM  Result Value Ref Range   Sodium 134 (L) 135 - 145 mmol/L   Potassium 4.3 3.5 - 5.1 mmol/L   Chloride 105 101 - 111 mmol/L   CO2 21 (L) 22 - 32 mmol/L   Glucose, Bld 125 (H) 65 - 99 mg/dL   BUN 47 (H) 6 - 20 mg/dL   Creatinine, Ser 3.10 (H) 0.61 - 1.24 mg/dL   Calcium 8.3 (L) 8.9 - 10.3 mg/dL   GFR calc non Af Amer 21 (L) >60 mL/min   GFR calc Af Amer 24 (L) >60 mL/min    Comment: (NOTE) The eGFR has been calculated using the CKD EPI equation. This calculation has not been validated in all clinical situations. eGFR's persistently <60 mL/min signify possible Chronic Kidney Disease.    Anion gap 8 5 - 15  Glucose, capillary     Status: Abnormal   Collection Time: 07/20/17  7:30 AM   Result Value Ref Range   Glucose-Capillary 109 (H) 65 - 99 mg/dL   Comment 1 Notify RN    Comment 2 Document in Chart   Glucose, capillary     Status: Abnormal   Collection Time: 07/20/17 11:38 AM  Result Value Ref Range   Glucose-Capillary 140 (H) 65 - 99 mg/dL   Comment 1 Notify RN    Comment 2 Document in Chart   Glucose, capillary     Status: Abnormal   Collection Time: 07/21/17  1:01 AM  Result Value Ref Range   Glucose-Capillary 125 (H) 65 - 99 mg/dL   Comment 1 Notify RN    Comment 2 Document in Chart   CBC     Status: Abnormal   Collection Time: 07/21/17  5:34 AM  Result Value Ref Range   WBC 7.8 4.0 - 10.5 K/uL   RBC 2.60 (L) 4.22 - 5.81 MIL/uL   Hemoglobin 8.9 (L) 13.0 - 17.0 g/dL   HCT 25.2 (L) 39.0 - 52.0 %   MCV 96.9 78.0 - 100.0 fL   MCH 34.2 (H) 26.0 - 34.0 pg   MCHC 35.3 30.0 - 36.0 g/dL   RDW 15.2 11.5 - 15.5 %   Platelets 58 (L) 150 - 400 K/uL    Comment: SPECIMEN CHECKED FOR CLOTS  Renal function panel     Status: Abnormal   Collection Time: 07/21/17  5:34 AM  Result Value Ref Range   Sodium 134 (L) 135 - 145 mmol/L   Potassium 4.4 3.5 - 5.1 mmol/L   Chloride 106 101 - 111 mmol/L   CO2 20 (L) 22 - 32 mmol/L   Glucose, Bld 132 (H) 65 - 99 mg/dL   BUN 55 (H) 6 - 20 mg/dL   Creatinine, Ser 3.15 (H) 0.61 - 1.24 mg/dL   Calcium 8.3 (L) 8.9 - 10.3 mg/dL   Phosphorus 4.0 2.5 - 4.6 mg/dL   Albumin 2.5 (L) 3.5 - 5.0 g/dL   GFR calc non Af Amer 21 (L) >60 mL/min   GFR calc Af Amer 24 (L) >60 mL/min    Comment: (NOTE) The eGFR has been calculated using the CKD EPI equation. This calculation has not been validated in all clinical situations. eGFR's persistently <60 mL/min signify possible Chronic Kidney Disease.  Anion gap 8 5 - 15  Glucose, capillary     Status: Abnormal   Collection Time: 07/21/17  6:24 AM  Result Value Ref Range   Glucose-Capillary 136 (H) 65 - 99 mg/dL      RADIOGRAPHY: US Paracentesis  Result Date: 07/19/2017 INDICATION:  Symptomatic ascites with shortness of breath. Rapid reaccumulation after paracentesis yesterday. Request for additional drainage. Patient received IV albumin prior to the procedure. EXAM: ULTRASOUND GUIDED THERAPEUTIC PARACENTESIS MEDICATIONS: None. COMPLICATIONS: None immediate. PROCEDURE: Informed written consent was obtained from the patient after a discussion of the risks, benefits and alternatives to treatment. Patient was informed that he was at increased risk of bleeding due to liver dysfunction, elevated INR, and depressed platelets. He accepts this level of risk due to degree of symptoms. A timeout was performed prior to the initiation of the procedure. Initial ultrasound scanning demonstrates a massive amount of ascites within the left lower abdominal quadrant, with tense abdomen and shallow breathing. The left lower abdomen was prepped and draped in the usual sterile fashion. 1% lidocaine was used for local anesthesia. Following this, a 19 gauge, 7-cm, Yueh catheter was introduced. An ultrasound image was saved for documentation purposes. The paracentesis was performed. Due to patient's bleeding risk for repeat punctures a larger volume of drainage than before was needed to allow time for medical stabilization of the patient's ascites. Although there was additional ascites available for withdrawal and the patient's blood pressure was stable, no additional drainage performed to ensure no adverse renal effects. The catheter was removed and a dressing was applied. The patient tolerated the procedure well without immediate post procedural complication. FINDINGS: A total of approximately 10 L of yellow ascitic fluid was removed. IMPRESSION: Successful ultrasound-guided paracentesis yielding 10 liters of peritoneal fluid. Electronically Signed   By: Monte Fantasia M.D.   On: 07/19/2017 17:36       PATHOLOGY:       ASSESSMENT & PLAN:   Anemia, Thrombocytopenia, Polyclonal gammopathy:  -Extensive lab  review performed.  Spleen normal in size on recent CT imaging. However, extensive cirrhosis.  HIV, Hep A/B/C antibodies all negative.  -Not likely to be multiple myeloma or Waldenstrom macroglobulinemia, as there is no monoclonal gammopathy. M-spike not detected; IFE normal and SPEP with polyclonal gammopathy. Elevated kappa/lambda light chain ratio could be d/t kidney disease.  Not likely to be plasma cell dyscrasia with normal IFE.  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  Calcium has actually been low (and is generally high in plasma cell/myeloproliferative neoplasm) and imaging may be helpful to determine any bone lesions.  -Anemia and thrombocytopenia likely secondary to liver and kidney disease.  Would recommend checking vitamin B12/folate levels, as nutritional deficiencies may be contributing to his decreased counts.  We are happy to see him as an outpatient for additional work-up if needed.  Suspect hepatorenal syndrome is contributing to his anemia & thrombocytopenia.   -Amyloidosis is a differential diagnosis as well, which may help explain some of his kidney and liver dysfunction. Would need a fat pad biopsy for diagnostic confirmation.  -Could also consider autoimmune hepatitis, as elevated gammaglobulins and low C3 and C4 may help support that diagnosis.  ANA negative and DNA antibodies also negative.  Of course, we defer to GI regarding the possibility of this being the cause of his current hepatic concerns.   -It would not be unreasonable to consider bone marrow  biopsy to rule out plasma cell dyscrasia/neoplam, if other etiologies are ruled out or deemed less likely.      Dispo:  -We are happy to see this patient as an outpatient at the cancer center for additional work-up and evaluation, as deemed appropriate by inpatient treating team at the time of discharge from the hospital.         This patient was not formally seen or evaluated; information gathered via chart review and discussion with treating physician. The patient and plan discussed with Dr. Talbert Cage via phone and she is in agreement with the aforementioned. No charge for this consultation.    Mike Craze, NP [place] [phone]

## 2017-07-21 NOTE — Progress Notes (Signed)
PROGRESS NOTE  Ardeen JourdainDavid L Ishii ZOX:[id number] DOB: 03/19/1961 DOA: 07/16/2017 PCP: Patient, No Pcp Per  Brief Narrative: 56 year old man PMH alcohol abuse, presented with generalized weakness, abdominal distention. Admitted for massive ascites secondary to alcoholic cirrhosis.  Assessment/Plan Decompensated alcoholic cirrhosis with associated thrombocytopenia and massive volume overload. Reportedly stopped drinking alcohol for months prior. Status post large volume paracentesis 8/27, 8/29, 8/31. -No significant clinical change. Repeat CMP in the morning, recalculate MELD then.  Acute kidney injury. No previous records available. -Discussed in detail with nephrology this morning. Slightly worse today. Etiology remains obscure. I reviewed the laboratory studies with oncology, their note also reviewed, not likely to be plasma cell dyscrasia. Did recommend skeletal survey. Could consider amyloidosis as well in differential. -Seems a next step could be the consideration of kidney biopsy if fails to improve. -Continue management per nephrology, will discuss again in the morning.  Possible SBP -Cultures remain negative. Continue antibiotics.  Diabetes mellitus type 2. -Remains stable. Continue SSI.  Alcohol abuse in remission  Left pleural effusion. -Follow clinically.  Severe malnutrition   Check B12 and folate levels, skeletal survey.  Prognosis remains guarded. I discussed recommendations with patient at the bedside in detail. He understands that renal function is not improving.  Blood pressure and clinical condition rebounded quickly with small fluid bolus. Suspect transient hypotension secondary to large volume paracentesis. Continue to monitor.  DVT prophylaxis: SCDs Code Status: DNR Family Communication: none Disposition Plan: home    Brendia Sacksaniel Derrik Mceachern, MD  [facility] Direct contact: [phone] --Via amion app OR  --[email or web address]; password [email or web address]  7PM-7AM contact  night coverage as above 07/21/2017, 5:13 PM  LOS: 5 days   Consultants:  Gastroenterology  Nephrology  Procedures:  Large volume paracentesis 8/27  Large volume paracentesis 8/29  Large volume paracentesis 8/31  Antimicrobials:  Ceftriaxone 8/26 >>  Interval history/Subjective: Patient felt okay this morning although low short of breath. Appetite has been poor. He was seen by GI today, recommended large volume paracentesis. This was completed this afternoon with removal of 13 L. Upon returning to the floor patient was noted be hypotensive. No pain. Reports shortness of breath.  Objective: Vitals: Afebrile, 18, 96, 100/62, 100% on 2 L nasal cannula.  Exam:     Constitutional. Appears ill but not toxic. Calm, mildly uncomfortable.  Respiratory. Clear to auscultation bilaterally. No wheezes, rales or rhonchi. Mild increased respiratory effort.  Cardiovascular. Regular rate and rhythm. No murmur, rub or gallop. 3+ bilateral lower extremity edema without significant change.  Abdomen is soft, nontender, markedly less distended status post paracentesis.  Psychiatric. Grossly normal mood and affect. Speech fluent and appropriate.   I have personally reviewed the following:  Urine output only partially recorded Labs:  Capillary blood sugars stable.  Creatinine slightly higher, 3.15. BUN slightly higher, 55. Potassium within normal limits.  Platelets lower, 58. Hemoglobin stable 8.9.   Scheduled Meds: . feeding supplement (ENSURE ENLIVE)  237 mL Oral TID BM  . insulin aspart  0-9 Units Subcutaneous TID WC  . [START ON 07/22/2017] spironolactone  100 mg Oral Daily   Continuous Infusions: . sodium chloride 75 mL/hr at 07/21/17 0630  . albumin human 25 g (07/20/17 2300)  . cefTRIAXone (ROCEPHIN)  IV Stopped (07/21/17 0049)  . furosemide      Principal Problem:   Cirrhosis of liver with ascites (HCC) Active Problems:   Ascites due to alcoholic cirrhosis (HCC)    Thrombocytopenia (HCC)   Pleural effusion, left   SBP (spontaneous bacterial  peritonitis) (HCC)   Hypoalbuminemia   Severe malnutrition (HCC)   AKI (acute kidney injury) (HCC)   Protein-calorie malnutrition, severe   History of alcohol abuse   Hyponatremia   LOS: 5 days    Time greater than 35 minutes, greater than 50% in counseling and coordination of care. Specifically, discussion with the patient, nephrologist, oncology nurse practitioner.

## 2017-07-21 NOTE — Progress Notes (Signed)
Subjective: Feels worse today. Notes increased weakness, difficulty walking. Feels his abdomen has swollen back up. Some shortness of breath today. Denies abdominal pain or any bleeding. Is wondering if he would be able to have a paracentesis today to help with the abdominal swelling. No other upper or lower GI symptoms.  Objective: Vital signs in last 24 hours: Temp:  [98.1 F (36.7 C)-98.3 F (36.8 C)] 98.3 F (36.8 C) (08/30 2300) Pulse Rate:  [90-91] 91 (08/30 2300) Resp:  [20] 20 (08/30 2126) BP: (103-110)/(63-64) 103/63 (08/30 2300) SpO2:  [98 %-100 %] 98 % (08/30 2300) Last BM Date: 07/19/17 General:   Alert and oriented, pleasant. Appears very weak. Head:  Normocephalic and atraumatic. Eyes:  No icterus, sclera clear. Conjuctiva pink.  Heart:  S1, S2 present, no murmurs noted.  Lungs: Clear to auscultation bilaterally, without wheezing, rales, or rhonchi.  Abdomen:  Bowel sounds present, non-tender, firm and distended. + fluid wave. Developing umbilical hernia noted. No rebound or guarding.  Msk:  Symmetrical without gross deformities. Pulses:  Normal bilateral DP pulses noted. Extremities:  Without clubbing. Noted 2-3+ pitting edema from ankles all the way up to and including his thighs. Neurologic:  Alert and  oriented x4 Psych:  Alert and cooperative. Normal mood and affect.  Intake/Output from previous day: 08/30 0701 - 08/31 0700 In: -  Out: 200 [Urine:200] Intake/Output this shift: Total I/O In: 124 [IV Piggyback:124] Out: -   Lab Results:  Recent Labs  07/19/17 0635 07/21/17 0534  WBC 7.0 7.8  HGB 9.2* 8.9*  HCT 26.1* 25.2*  PLT 71* 58*   BMET  Recent Labs  07/19/17 0635 07/20/17 0442 07/21/17 0534  NA 134* 134* 134*  K 4.3 4.3 4.4  CL 104 105 106  CO2 24 21* 20*  GLUCOSE 132* 125* 132*  BUN 45* 47* 55*  CREATININE 3.09* 3.10* 3.15*  CALCIUM 8.7* 8.3* 8.3*   LFT  Recent Labs  07/19/17 0635 07/21/17 0534  ALBUMIN 2.2* 2.5*    PT/INR  Recent Labs  07/18/17 1233  LABPROT 22.4*  INR 1.99   Hepatitis Panel  Recent Labs  07/19/17 1149  HEPBSAG Negative  HCVAB 0.1  HEPAIGM Negative  HEPBIGM Negative     Studies/Results: US Paracentesis  Result Date: 07/19/2017 INDICATION: Symptomatic ascites with shortness of breath. Rapid reaccumulation after paracentesis yesterday. Request for additional drainage. Patient received IV albumin prior to the procedure. EXAM: ULTRASOUND GUIDED THERAPEUTIC PARACENTESIS MEDICATIONS: None. COMPLICATIONS: None immediate. PROCEDURE: Informed written consent was obtained from the patient after a discussion of the risks, benefits and alternatives to treatment. Patient was informed that he was at increased risk of bleeding due to liver dysfunction, elevated INR, and depressed platelets. He accepts this level of risk due to degree of symptoms. A timeout was performed prior to the initiation of the procedure. Initial ultrasound scanning demonstrates a massive amount of ascites within the left lower abdominal quadrant, with tense abdomen and shallow breathing. The left lower abdomen was prepped and draped in the usual sterile fashion. 1% lidocaine was used for local anesthesia. Following this, a 19 gauge, 7-cm, Yueh catheter was introduced. An ultrasound image was saved for documentation purposes. The paracentesis was performed. Due to patient's bleeding risk for repeat punctures a larger volume of drainage than before was needed to allow time for medical stabilization of the patient's ascites. Although there was additional ascites available for withdrawal and the patient's blood pressure was stable, no additional drainage performed to ensure  no adverse renal effects. The catheter was removed and a dressing was applied. The patient tolerated the procedure well without immediate post procedural complication. FINDINGS: A total of approximately 10 L of yellow ascitic fluid was removed. IMPRESSION:  Successful ultrasound-guided paracentesis yielding 10 liters of peritoneal fluid. Electronically Signed   By: Monte Fantasia M.D.   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 Started on Rocephin IV. Worsened renal function and renal on board. Anemia noted but without overt GI bleeding, likely multifactorial. Overall poorprognosis. MELD 27 yesterday. Viral markers indicate immunity to hep b.   Anasarca with severe hypoalbuminemia. Receiving albumin, lasix. LVAP of 10 liters done yesterday afternoon. His cell count is improved, absolute PMN count down from 1298 to 526 on his repeat paracentesis.     Multiple Myeloma work up in progress given albumin/protein disassociation.   Today he appears to be worsening. His abdomen has reaccumulated significant ascites and having some shortness of breath. His renal function is stable/slightly worse. Slowly advancing hepatorenal syndrome in the differentials. His LE edema/anasarca is not much improved and continues with 2-3+ pitting edema. He appears weak. Is asking about possible repeat paracentesis for his abdominal swelling. Vitals have been stable, afebrile.  Plan: 1. Continue antibiotics 2. Repeat liver labs tomorrow for updated MELD 3. Paracentesis today (which is essentially theraputic at this point). 4. Supportive measures  **PLEASE NOTE THERE WILL BE NO GI COVERAGE FROM TODAY (FRIDAY 07/21/17) AT 5:00 PM THROUGH 7:00 AM MONDAY (07/24/17)**  Thank you for allowing [place] to participate in the care of Shawn Diss, DNP, AGNP-C Adult & Gerontological Nurse Practitioner [facility]     LOS: 5 days    07/21/2017, 11:38 AM

## 2017-07-21 NOTE — Progress Notes (Signed)
Subjective: Interval History: Patient denies any nausea or vomiting. He feels weak. He complains of increasing as her abdominal girth.  Objective: Vital signs in last 24 hours: Temp:  [98.1 F (36.7 C)-98.3 F (36.8 C)] 98.3 F (36.8 C) (08/30 2300) Pulse Rate:  [90-91] 91 (08/30 2300) Resp:  [20] 20 (08/30 2126) BP: (103-110)/(63-64) 103/63 (08/30 2300) SpO2:  [98 %-100 %] 98 % (08/30 2300) Weight change:   Intake/Output from previous day: 08/30 0701 - 08/31 0700 In: -  Out: 200 [Urine:200] Intake/Output this shift: Total I/O In: 124 [IV Piggyback:124] Out: -   General appearance: alert, cooperative and no distress Resp: diminished breath sounds bilaterally Cardio: regular rate and rhythm GI: Distended, nontender Extremities: edema 2+ edema bilaterally  Lab Results:  Recent Labs  07/19/17 0635 07/21/17 0534  WBC 7.0 7.8  HGB 9.2* 8.9*  HCT 26.1* 25.2*  PLT 71* 58*   BMET:   Recent Labs  07/20/17 0442 07/21/17 0534  NA 134* 134*  K 4.3 4.4  CL 105 106  CO2 21* 20*  GLUCOSE 125* 132*  BUN 47* 55*  CREATININE 3.10* 3.15*  CALCIUM 8.3* 8.3*   No results for input([name]): PTH in the last 72 hours. Iron Studies:   Recent Labs  07/19/17 1149  IRON 21*  TIBC NOT CALCULATED  FERRITIN 478*    Studies/Results: US Paracentesis  Result Date: 07/19/2017 INDICATION: Symptomatic ascites with shortness of breath. Rapid reaccumulation after paracentesis yesterday. Request for additional drainage. Patient received IV albumin prior to the procedure. EXAM: ULTRASOUND GUIDED THERAPEUTIC PARACENTESIS MEDICATIONS: None. COMPLICATIONS: None immediate. PROCEDURE: Informed written consent was obtained from the patient after a discussion of the risks, benefits and alternatives to treatment. Patient was informed that he was at increased risk of bleeding due to liver dysfunction, elevated INR, and depressed platelets. He accepts this level of risk due to degree of symptoms. A  timeout was performed prior to the initiation of the procedure. Initial ultrasound scanning demonstrates a massive amount of ascites within the left lower abdominal quadrant, with tense abdomen and shallow breathing. The left lower abdomen was prepped and draped in the usual sterile fashion. 1% lidocaine was used for local anesthesia. Following this, a 19 gauge, 7-cm, Yueh catheter was introduced. An ultrasound image was saved for documentation purposes. The paracentesis was performed. Due to patient'[name] bleeding risk for repeat punctures a larger volume of drainage than before was needed to allow time for medical stabilization of the patient'[name] ascites. Although there was additional ascites available for withdrawal and the patient'[name] blood pressure was stable, no additional drainage performed to ensure no adverse renal effects. The catheter was removed and a dressing was applied. The patient tolerated the procedure well without immediate post procedural complication. FINDINGS: A total of approximately 10 L of yellow ascitic fluid was removed. IMPRESSION: Successful ultrasound-guided paracentesis yielding 10 liters of peritoneal fluid. Electronically Signed   By: Monte Fantasia M.D.   On: 07/19/2017 17:36    I have reviewed the patient'[name] current medications.  Assessment/Plan: Problem #1 renal failure: Most likely chronic. Presently his renal function is not showing significant change. Patient says that he is incontinent at night hence urine output, not adequately monitored.  Etiology at this moment is not clear. At this moment multiple calls are being entertained. 1] patient was low complement including C3 and C4. ASO titer is negative, hep C antibody not reacting, ANA is negative. Patient was hepatitis B antibody positive but antigen is negative and sign of  being imunity. Hence the low complement could be secondary to his liver cirrhosis but need to rule out infectious source b] patient was elevated IgG, IgM and  IgG in the serum. His urine show high Kappa to lambda ratio. 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 Multiple myeloma cannot be ruled out. Problem #2 history of liver cirrhosis: Patient with significant anasarca. His status post paracentesis Problem #3 anemia: His hemoglobin is below target goal. His ferritin is 478 and iron saturation is 21%.. At this moment seems to be anemia of chronic disease including chronic renal failure. Problem #4 hypertension: His blood pressure is reasonably controlled Problem #5 history of diabetes Problem #6 bone and mineral disorder: His calcium and phosphorus is in range. Plan: 1] will DC IV fluid 2] will increase Lasix to 160 mg IV twice a day and increase Aldactone to 100 mg once a day 3] will check his renal panel and CBC in the morning  4] at this moment oncology consult 5] Check RPR   LOS: 5 days   Shawn [name] 07/21/2017,10:57 AM

## 2017-07-21 NOTE — Sedation Documentation (Signed)
Pt resting with eyes shut.  No distress.  Arouses easily.

## 2017-07-21 NOTE — Progress Notes (Signed)
Patient felt SOB when sitting up. Checked O2 and it was 99%. BP was 88/41 and HR was 105; RR 22. Got pt to lay down and rechecked and BP was 68/36. Notified Dr. Irene LimboGoodrich and rechecked with manual cuff and BP was 82/53.  Dr. Reece AgarG ordered a 500 ml NS fluid bolus.  Will continue to recheck Pt's BPs.

## 2017-07-21 NOTE — Sedation Documentation (Signed)
13 liters of fluid removed.  Dressing to right side of abdomen with no drainage.  Pt tolerated well.

## 2017-07-21 NOTE — Progress Notes (Addendum)
Initial Nutrition Assessment  DOCUMENTATION CODES:  Severe malnutrition in context of chronic illness  INTERVENTION:  As pt essentially receiving all nutrition from supplements and nothing from meals, Increase Ensure Enlive po TID, each supplement provides 350 kcal and 20 grams of protein  Was able to obtain some food request from patient, Requesting Cold cereal. Will add with meals. Use whole milk for extra kcals.  DC snacks in between meals-pt consistently declines  Recommend a renal-appropriate MVI per MD discretion. -More specifically, Would recommend Vitamin D, thiamin, folate supplementation   NUTRITION DIAGNOSIS:  Malnutrition (Severe in Chronic context) related to poor appetite, early satiety, chronic illness (alcoholism progressing to cirrhosis) as evidenced by severe depletion of muscle and fat mass.  -ongoing/worsening  GOAL:  Patient will meet greater than or equal to 90% of their needs   -NOT met  MONITOR:  PO intake, Diet advancement, Supplement acceptance, Labs, Weight trends, I & O's  ASSESSMENT:  56 y/o male PMHx etoh abuse (says stopped 4 months ago), HTN, DM. Presented with 3 months of abdominal distension and weakness. CT reveals cirrhosis and massive ascites. Also revealed to have AKI. Admitted for management.   Interval hx:  -Nephro c/s due to worsening renal fx/concern for Hepatorenal syndrome.  -S/p 10 Liter paracentesis on 8/29.  -Intake is poor -S/P 13 liter paracentesis today 8/31 ______________________________  Patient has no documented intake on 8/29-8/30. Only documented meal has was on 8/28, when consumed 75% of Dinner. [facility] dietary ambassadors have communicated to RD that the pt is declining his snack due to nausea/poor appetite/abdominal distention. It appears that he is consuming Ensure Most of time.   Pt seen immediately s/p 13 liter paracentesis. He is not feeling well. He is weak, very soft spoken. MD at bedside at [place] arrival. He says it  feels like he has gas across his abdomen.   RD asked if there was anything at all he would consume. RD will not be on site over weekend. Stressed importance of nutrition; of which he has had little thus far. He mentions cereal. Will order with meals. Add whole milk for extra kcals.   He had lost 25 lbs as a result of his 10 L paracentesis on 8/29. He quickly requested another due to quick re accumulation. No new wt since his paracentesis this afternoon, likely has dropped to ~165 lbs. He had stated his UBW used to be 178 lbs.  RD made GI NP aware of patients very poor intake. Pt with slowly worsening renal function. Poor prognosis.  Given level of ascites/edema, using IBW for dosing wt  NFPE: Severe muscle/fat wasting of head and upper extremities. Lower extremities edematousAbdomen markedly distended  Labs: Albumin: 2.5 Na: 134, K:4.4, bun/creat:41/2.86,-> 55/3.15    Meds: Insulin, Aldactone, Lasix, IV albumin, IV ABx, IV lasix, IVF   Recent Labs Lab 07/19/17 0635 07/20/17 0442 07/21/17 0534  NA 134* 134* 134*  K 4.3 4.3 4.4  CL 104 105 106  CO2 24 21* 20*  BUN 45* 47* 55*  CREATININE 3.09* 3.10* 3.15*  CALCIUM 8.7* 8.3* 8.3*  PHOS 4.3  --  4.0  GLUCOSE 132* 125* 132*   Diet Order:  Diet Heart Room service appropriate? Yes; Fluid consistency: Thin  Skin:  Reviewed, no issues  Last BM:  8/29   Height:  Ht Readings from Last 1 Encounters:  07/16/17 5' 9"  (1.753 m)   Weight:  Wt Readings from Last 1 Encounters:  07/20/17 191 lb 9.6 oz (86.9 kg)  Wt Readings from Last 10 Encounters:  07/20/17 191 lb 9.6 oz (86.9 kg)  02/11/12 210 lb (95.3 kg)   Ideal Body Weight:  72.73 kg  BMI:  Body mass index is 28.29 kg/m.  Given level of ascites/edema, using IBW for dosing wt Estimated Nutritional Needs:  Kcal:  2250-2450 kcals (HBE x1.5 +/- 100 kcals) Protein:  100-116 g Pro (1.4-1.6 g/kg ibw) Fluid:  Per MD  EDUCATION NEEDS:  Education needs no appropriate at this  time  Burtis Junes RD, LDN, [place] Clinical Nutrition Pager: [phone] 07/21/2017 3:30 PM

## 2017-07-21 NOTE — Procedures (Signed)
PreOperative Dx: Alcoholic cirrhosis, ascites Postoperative Dx: Alcoholic cirrhosis, ascites Procedure:   US guided paracentesis Radiologist:  Tyron RussellBoles Anesthesia:  10 ml of1% lidocaine Specimen:  13 L of yellow ascitic fluid EBL:   < 1 ml Complications: None

## 2017-07-22 ENCOUNTER — Inpatient Hospital Stay (HOSPITAL_COMMUNITY): Payer: No Typology Code available for payment source

## 2017-07-22 LAB — CBC WITH DIFFERENTIAL/PLATELET
Basophils Absolute: 0 10*3/uL (ref 0.0–0.1)
Basophils Relative: 0 %
Eosinophils Absolute: 0.3 10*3/uL (ref 0.0–0.7)
Eosinophils Relative: 4 %
HCT: 24.8 % — ABNORMAL LOW (ref 39.0–52.0)
HEMOGLOBIN: 8.7 g/dL — AB (ref 13.0–17.0)
LYMPHS ABS: 1.1 10*3/uL (ref 0.7–4.0)
LYMPHS PCT: 16 %
MCH: 34 pg (ref 26.0–34.0)
MCHC: 35.1 g/dL (ref 30.0–36.0)
MCV: 96.9 fL (ref 78.0–100.0)
Monocytes Absolute: 1.1 10*3/uL — ABNORMAL HIGH (ref 0.1–1.0)
Monocytes Relative: 15 %
NEUTROS ABS: 4.7 10*3/uL (ref 1.7–7.7)
NEUTROS PCT: 65 %
Platelets: 57 10*3/uL — ABNORMAL LOW (ref 150–400)
RBC: 2.56 MIL/uL — AB (ref 4.22–5.81)
RDW: 15.7 % — ABNORMAL HIGH (ref 11.5–15.5)
WBC: 7.3 10*3/uL (ref 4.0–10.5)

## 2017-07-22 LAB — COMPREHENSIVE METABOLIC PANEL
ALBUMIN: 2.9 g/dL — AB (ref 3.5–5.0)
ALK PHOS: 31 U/L — AB (ref 38–126)
ALT: 7 U/L — ABNORMAL LOW (ref 17–63)
ANION GAP: 8 (ref 5–15)
AST: 17 U/L (ref 15–41)
BUN: 57 mg/dL — ABNORMAL HIGH (ref 6–20)
CHLORIDE: 107 mmol/L (ref 101–111)
CO2: 20 mmol/L — AB (ref 22–32)
Calcium: 8.5 mg/dL — ABNORMAL LOW (ref 8.9–10.3)
Creatinine, Ser: 3.12 mg/dL — ABNORMAL HIGH (ref 0.61–1.24)
GFR calc Af Amer: 24 mL/min — ABNORMAL LOW (ref >60)
GFR calc non Af Amer: 21 mL/min — ABNORMAL LOW (ref >60)
GLUCOSE: 120 mg/dL — AB (ref 65–99)
POTASSIUM: 4.5 mmol/L (ref 3.5–5.1)
SODIUM: 135 mmol/L (ref 135–145)
Total Bilirubin: 0.9 mg/dL (ref 0.3–1.2)
Total Protein: 5.2 g/dL — ABNORMAL LOW (ref 6.5–8.1)

## 2017-07-22 LAB — GLUCOSE, CAPILLARY
GLUCOSE-CAPILLARY: 148 mg/dL — AB (ref 65–99)
Glucose-Capillary: 116 mg/dL — ABNORMAL HIGH (ref 65–99)
Glucose-Capillary: 153 mg/dL — ABNORMAL HIGH (ref 65–99)

## 2017-07-22 LAB — PROTIME-INR
INR: 2.46
Prothrombin Time: 26.5 seconds — ABNORMAL HIGH (ref 11.4–15.2)

## 2017-07-22 LAB — VITAMIN B12: Vitamin B-12: 531 pg/mL (ref 180–914)

## 2017-07-22 LAB — CULTURE, BODY FLUID W GRAM STAIN -BOTTLE: Culture: NO GROWTH

## 2017-07-22 LAB — CULTURE, BODY FLUID-BOTTLE

## 2017-07-22 NOTE — Progress Notes (Signed)
PROGRESS NOTE  Ardeen JourdainDavid L Bradburn ZOX:[id number] DOB: 02/28/1961 DOA: 07/16/2017 PCP: Patient, No Pcp Per  Brief Narrative: 56 year old man PMH alcohol abuse, presented with generalized weakness, abdominal distention. Admitted for massive ascites secondary to alcoholic cirrhosis.  Assessment/Plan Decompensated alcoholic cirrhosis with associated thrombocytopenia and massive volume overload. Reportedly stopped drinking alcohol for months prior. Status post large volume paracentesis 8/27, 8/29, 8/31. -He remains awake and alert. Respiratory status is stable. He appears to have rapidly recurring ascites is demonstrated by the need for frequent large-volume paracenteses. -MELD 29 (27-32% 90-day mortality) -Prognosis is guarded. Will involve palliative medicine Week.  Acute kidney injury. No previous records available. -Creatinine slightly improved. Urine output improved. Hopefully turning the corner. Appreciate nephrology involvement. Etiology remains unclear, hypertension, HTN, paraproteinemia may be contributing. -Continue Lasix, spironolactone, albumin as per nephrology. Repeat BMP in a.m.  SBP -First culture no growth, final. Culture from second paracentesis still pending. We'll continue antibiotics until this cultures final then change to oral suppressive therapy.  Diabetes mellitus type 2. -Stable. Continue sliding scale insulin.  Alcohol abuse in remission  Left pleural effusion. -Follow clinically.  Severe malnutrition   Clinical status remains about the same. Hypotensive episode yesterday post large-volume paracentesis has resolved. Ascites is reaccumulating but no need for tap today. Renal function with perhaps slight improvement. Prognosis remains guarded.  Follow-up B12, folate levels, skeletal survey  DVT prophylaxis: SCDs Code Status: DNR Family Communication: none Disposition Plan: home    Brendia Sacksaniel Brixon Zhen, MD  [facility] Direct contact: [phone] --Via  amion app OR  --[email or web address]; password [email or web address]  7PM-7AM contact night coverage as above 07/22/2017, 11:05 AM  LOS: 6 days   Consultants:  Gastroenterology  Nephrology  Procedures:  Large volume paracentesis 8/27  Large volume paracentesis 8/29  Large volume paracentesis 8/31  Antimicrobials:  Ceftriaxone 8/26 >>  Interval history/Subjective: Slept better last night. Breathing is better today. Eating okay. Increased fluid in the abdomen noted.  Objective: Vitals: Afebrile, 97.9, 14, 89, 98/57, 100% on room air  Exam:     Constitutional. Appears calm, comfortable. Appears better today.  Respiratory. Clear to auscultation bilaterally. No wheezes, rales or rhonchi. Normal respiratory effort.  Cardiovascular. Regular rate and rhythm. No murmur, rub or gallop. No change in 4+ bilateral lower extremity edema  Abdomen is more distended today. Nontender.  Psychiatric. Grossly normal mood and affect. Speech fluent and appropriate.   I have personally reviewed the following:  Urine output 2350 Labs:  Capillary blood sugars stable  BUN stable, 57. Creatinine slightly improved, 3.12. LFTs noted. Total bilirubin within normal limits.  Platelet count stable, 57. Hemoglobin stable 8.7. WBC 7.3.  INR 2.46.   Scheduled Meds: . feeding supplement (ENSURE ENLIVE)  237 mL Oral TID BM  . insulin aspart  0-9 Units Subcutaneous TID WC  . spironolactone  100 mg Oral Daily  . traZODone  50 mg Oral QHS   Continuous Infusions: . albumin human 25 g (07/20/17 2300)  . cefTRIAXone (ROCEPHIN)  IV Stopped (07/21/17 2347)  . furosemide Stopped (07/22/17 0944)    Principal Problem:   Cirrhosis of liver with ascites (HCC) Active Problems:   Ascites due to alcoholic cirrhosis (HCC)   Thrombocytopenia (HCC)   Pleural effusion, left   SBP (spontaneous bacterial peritonitis) (HCC)   Hypoalbuminemia   Severe malnutrition (HCC)   AKI (acute kidney injury) (HCC)   Protein-calorie  malnutrition, severe   History of alcohol abuse   Hyponatremia   LOS: 6 days   Time, greater than  35 minutes, greater than 50% in counseling of the patient on liver and kidney disease as well as discussion of the case with the nephrologist.

## 2017-07-22 NOTE — Progress Notes (Signed)
Subjective: Interval History: Patient feels better as far as his breathing is concerned. Appetite is ok  Objective: Vital signs in last 24 hours: Temp:  [97.7 F (36.5 C)-97.9 F (36.6 C)] 97.9 F (36.6 C) (09/01 0515) Pulse Rate:  [83-105] 89 (09/01 0515) Resp:  [14-22] 14 (09/01 0515) BP: (68-101)/(36-65) 98/57 (09/01 0515) SpO2:  [99 %-100 %] 100 % (09/01 0515) Weight change:   Intake/Output from previous day: 08/31 0701 - 09/01 0700 In: 4405 [P.O.:1080; I.V.:2635; IV Piggyback:690] Out: 2350 [Urine:2350] Intake/Output this shift: No intake/output data recorded.  General appearance: alert, cooperative and no distress Resp: diminished breath sounds bilaterally Cardio: regular rate and rhythm GI: Distended, nontender Extremities: edema 2+ edema bilaterally  Lab Results:  Recent Labs  07/21/17 0534 07/22/17 0538  WBC 7.8 7.3  HGB 8.9* 8.7*  HCT 25.2* 24.8*  PLT 58* 57*   BMET:   Recent Labs  07/21/17 0534 07/22/17 0538  NA 134* 135  K 4.4 4.5  CL 106 107  CO2 20* 20*  GLUCOSE 132* 120*  BUN 55* 57*  CREATININE 3.15* 3.12*  CALCIUM 8.3* 8.5*   No results for input(s): PTH in the last 72 hours. Iron Studies:   Recent Labs  07/19/17 1149  IRON 21*  TIBC NOT CALCULATED  FERRITIN 478*    Studies/Results: [place] Paracentesis  Result Date: 07/21/2017 INDICATION: Alcoholic cirrhosis, ascites EXAM: ULTRASOUND GUIDED  PARACENTESIS MEDICATIONS: None. COMPLICATIONS: None immediate. PROCEDURE: Procedure, benefits, and risks of procedure were discussed with patient. Written informed consent for procedure was obtained. Time out protocol followed. Adequate collection of ascites localized by ultrasound in RIGHT lower quadrant. Skin prepped and draped in usual sterile fashion. Skin and soft tissues anesthetized with 10 mL of 1% lidocaine. 5 [place] Yueh catheter placed into peritoneal cavity. 13.0 of clear yellow fluid aspirated by vacuum bottle suction. Procedure  tolerated well by patient without immediate complication. FINDINGS: As above IMPRESSION: Successful ultrasound-guided paracentesis yielding 13 liters of peritoneal fluid. Electronically Signed   By: Ulyses SouthwardMark  Boles M.D.   On: 07/21/2017 14:55    I have reviewed the patient's current medications.  Assessment/Plan: Problem #1 renal failure: Most likely chronic. Presently his renal function is stable with slight decrease in his creatinine. Etiology not clear but hypertension/ ATN/paraprothenmia may contribute to his renal failure.  Problem #2 history of liver cirrhosis: Patient with significant anasarca. His status post paracentesis . Patient on lasix and aldactone. His urine out put has improved and had 2300 cc over the last 24 hours Problem #3 anemia: His hemoglobin is below target goal. His ferritin is 478 and iron saturation is 21%.. At this moment seems to be anemia of chronic disease including chronic renal failure. Problem #4 hypertension: His blood pressure is reasonably controlled Problem #5 history of diabetes Problem #6 bone and mineral disorder: His calcium and phosphorus is in range. 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 Oncology impute appreciated  Plan: 1] Continue with present treatment 2]Renal panel in am   LOS: 6 days   Rayansh Herbst S 07/22/2017,9:15 AM

## 2017-07-23 LAB — RENAL FUNCTION PANEL
Albumin: 2.9 g/dL — ABNORMAL LOW (ref 3.5–5.0)
Anion gap: 9 (ref 5–15)
BUN: 63 mg/dL — ABNORMAL HIGH (ref 6–20)
CHLORIDE: 105 mmol/L (ref 101–111)
CO2: 20 mmol/L — AB (ref 22–32)
CREATININE: 3.17 mg/dL — AB (ref 0.61–1.24)
Calcium: 8.6 mg/dL — ABNORMAL LOW (ref 8.9–10.3)
GFR calc non Af Amer: 20 mL/min — ABNORMAL LOW (ref >60)
GFR, EST AFRICAN AMERICAN: 24 mL/min — AB (ref >60)
Glucose, Bld: 118 mg/dL — ABNORMAL HIGH (ref 65–99)
Phosphorus: 4.4 mg/dL (ref 2.5–4.6)
Potassium: 4.7 mmol/L (ref 3.5–5.1)
Sodium: 134 mmol/L — ABNORMAL LOW (ref 135–145)

## 2017-07-23 LAB — GLUCOSE, CAPILLARY
GLUCOSE-CAPILLARY: 157 mg/dL — AB (ref 65–99)
GLUCOSE-CAPILLARY: 124 mg/dL — AB (ref 65–99)
Glucose-Capillary: 107 mg/dL — ABNORMAL HIGH (ref 65–99)
Glucose-Capillary: 145 mg/dL — ABNORMAL HIGH (ref 65–99)

## 2017-07-23 LAB — RHEUMATOID FACTOR

## 2017-07-23 LAB — RPR: RPR Ser Ql: NONREACTIVE

## 2017-07-23 NOTE — Progress Notes (Signed)
Subjective: Interval History: Patient complains of feeling sleepy.Poor appetite mainly because he does not like the hospital food. Patient also complains of increase in his abdominal distention and some discomfort.  Objective: Vital signs in last 24 hours: Temp:  [97.7 F (36.5 C)-98.2 F (36.8 C)] 98.2 F (36.8 C) (09/02 0640) Pulse Rate:  [86-99] 99 (09/02 0640) Resp:  [15-16] 16 (09/02 0640) BP: (98-103)/(58-59) 103/58 (09/02 0640) SpO2:  [98 %-100 %] 98 % (09/02 0640) Weight change:   Intake/Output from previous day: 09/01 0701 - 09/02 0700 In: 232 [IV Piggyback:232] Out: -  Intake/Output this shift: No intake/output data recorded.  General appearance: alert, cooperative and no distress Resp: diminished breath sounds bilaterally Cardio: regular rate and rhythm GI: Distended, nontender Extremities: edema 2+ edema bilaterally  Lab Results:  Recent Labs  07/21/17 0534 07/22/17 0538  WBC 7.8 7.3  HGB 8.9* 8.7*  HCT 25.2* 24.8*  PLT 58* 57*   BMET:   Recent Labs  07/22/17 0538 07/23/17 0415  NA 135 134*  K 4.5 4.7  CL 107 105  CO2 20* 20*  GLUCOSE 120* 118*  BUN 57* 63*  CREATININE 3.12* 3.17*  CALCIUM 8.5* 8.6*   No results for input(s): PTH in the last 72 hours. Iron Studies:  No results for input(s): IRON, TIBC, TRANSFERRIN, FERRITIN in the last 72 hours.  Studies/Results: US Paracentesis  Result Date: 07/21/2017 INDICATION: Alcoholic cirrhosis, ascites EXAM: ULTRASOUND GUIDED  PARACENTESIS MEDICATIONS: None. COMPLICATIONS: None immediate. PROCEDURE: Procedure, benefits, and risks of procedure were discussed with patient. Written informed consent for procedure was obtained. Time out protocol followed. Adequate collection of ascites localized by ultrasound in RIGHT lower quadrant. Skin prepped and draped in usual sterile fashion. Skin and soft tissues anesthetized with 10 mL of 1% lidocaine. 5 [place] Yueh catheter placed into peritoneal cavity. 13.0 of  clear yellow fluid aspirated by vacuum bottle suction. Procedure tolerated well by patient without immediate complication. FINDINGS: As above IMPRESSION: Successful ultrasound-guided paracentesis yielding 13 liters of peritoneal fluid. Electronically Signed   By: Lavonia Dana M.D.   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 Current history of alcoholic cirrhosis an acute renal insufficiency. EXAM: METASTATIC BONE SURVEY COMPARISON:  Chest x-ray 07/16/2017. Bone window images from CT abdomen and pelvis 07/16/2017. Left foot x-rays 09/04/2004. FINDINGS: The following bones were imaged: Lateral skull:  No lytic lesions. AP views of both shoulders: No lytic lesions. Mild degenerative changes involving both acromioclavicular joints. AP views of both humeri:  No lytic lesions or endosteal scalloping. AP views of both forearms: No lytic lesions or endosteal scalloping. Cervical spine, AP and cross-table lateral views: Straightening of the usual cervical lordosis. Visualization only through the upper endplate of C6 on the lateral view. No lytic lesions. Thoracic spine, AP and cross-table lateral views: Cross-table lateral image is underexposed, though the AP image is diagnostic. No lytic lesions. Mild degenerative disc disease and spondylosis involving the mid and lower thoracic spine. Lumbar spine, AP and cross-table lateral views: 5 non-rib-bearing lumbar vertebrae with anatomic alignment. No lytic lesions. Severe disc space narrowing at L5-S1. Moderate disc space narrowing at L4-5. PA chest: No visible lytic lesions involving the ribs. Cardiac silhouette mildly enlarged, unchanged. Elevation of the right hemidiaphragm to shown on the recent CT to be secondary to a large amount of ascites. Small left pleural effusion with associated passive atelectasis in the left lower  lobe, unchanged. Pulmonary venous  hypertension without overt edema. AP pelvis: No lytic lesions. Well-preserved joint spaces in both hips. Sacroiliac joints and symphysis pubis intact. AP views of both femurs:  No lytic lesions or endosteal scalloping. Lateral views of both tibia-fibula: No lytic lesions or endosteal scalloping. IMPRESSION: No evidence of multiple myeloma or other generalized bone disorder. Electronically Signed   By: Evangeline Dakin M.D.   On: 07/22/2017 15:42    I have reviewed the patient's current medications.  Assessment/Plan: Problem #1 renal failure: Most likely chronic. Presently his renal function is stable . Etiology not clear but hypertension/ ATN/paraprothenmia may contribute to his renal failure.  Problem #2 history of liver cirrhosis: Patient with significant anasarca. His status post paracentesis . Patient on lasix and aldactone. His urine out put is poorly documented. Patient with increased abdominal distention. Problem #3 anemia: His hemoglobin is below target goal. His ferritin is 478 and iron saturation is 21%.. At this moment seems to be anemia of chronic disease including chronic renal failure. Problem #4 hypertension: His blood pressure is reasonably controlled Problem #5 history of diabetes Problem #6 bone and mineral disorder: His calcium and phosphorus is in range. 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 RPR is non-reactive. Problem #8 Patient's Serum IFE shows Polyclonal gammopathy and urine IFE reveals elevated Free light chain excretion rate and elevated Kappa to Lambda ratio. Oncology impute appreciated  Plan: 1] Continue with present treatment 2]Renal panel in am   LOS: 7 days   Treshun Wold S 07/23/2017,9:39 AM

## 2017-07-23 NOTE — Progress Notes (Signed)
PROGRESS NOTE  Shawn JourdainDavid L [name] RUE:[id number] DOB: 12/02/1960 DOA: 07/16/2017 PCP: Patient, No Pcp Per  Brief Narrative: 56 year old man PMH alcohol abuse, presented with generalized weakness, abdominal distention. Admitted for massive ascites secondary to alcoholic cirrhosis.  Assessment/Plan Decompensated alcoholic cirrhosis with associated thrombocytopenia and massive volume overload. Reportedly stopped drinking alcohol for months prior. Status post large volume paracentesis 8/27, 8/29, 8/31. -He remains alert and respiratory status is stable. Ascites is reaccumulating but not impacting breathing at this point. Suspect he'll need Large-volume paracentesis in 48 hours.  -MELD 29 (27-32% 90-day mortality) -Prognosis remains guarded. Will involve palliative medicine 9/4.  Acute kidney injury. No previous records available. -Creatinine without significant change last 5 days. May be a new baseline. Nonoliguric by report. -Continue Lasix, spironolactone, present management per nephrology. BMP in a.m. -Skeletal survey was negative. Oncology does not feel plasma cell dyscrasias likely. Nephrology does not think amyloidosis as likely. Could consider bone biopsy or kidney biopsy next week.  SBP. First culture no growth, final. -Afebrile, asymptomatic. Second peritoneal fluid culture no growth 4 days. Continue antibiotics, will plan transition to oral therapy, likely in the next 48 hours.  Diabetes mellitus type 2. -Remains stable. Continue sliding scale insulin.  Alcohol abuse in remission  Left pleural effusion. -Asymptomatic.  Severe malnutrition -B12 and folate levels adequate   Prognosis remains guarded but he appears to be stabilizing from a renal perspective. He is likely at this point to need twice-weekly large-volume paracentesis which will be arranged prior to discharge. Will involve palliative medicine next week, maybe only go home in 48 hours if renal function is without  significant change.  DVT prophylaxis: SCDs Code Status: DNR Family Communication: none Disposition Plan: home    Brendia Sacksaniel Chardae Mulkern, MD  [facility] Direct contact: [phone] --Via amion app [place]  --[email or web address]; password [email or web address]  7PM-7AM contact night coverage as above 07/23/2017, 11:57 AM  LOS: 7 days   Consultants:  Gastroenterology  Nephrology  Procedures:  Large volume paracentesis 8/27  Large volume paracentesis 8/29  Large volume paracentesis 8/31  Antimicrobials:  Ceftriaxone 8/26 >>  Interval history/Subjective: Sleeping okay. Now on an air mattress and he has less discomfort in his bottom. Poor appetite, does not like the food. Eating some fruit that his girlfriend brought. Swelling increasing in the abdomen but breathing is fine. Continues to urinate.   Objective: Vitals: remains afebrile. 98.2, 16, 99, 103/58.   Exam:     Constitutional. Appears calm, comfortable.  Cardiovascular. Regular rate and rhythm. No murmur, rub or gallop. 3+ bilateral lower extremity edema without significant change.   Respiratory. Clear to auscultation bilaterally. No wheezes, rales or rhonchi. Normal respiratory effort. Speaks in full sentences.  Abdomen. Increased distention today but nontender, soft. There is more fluid today.  Psychiatric. Grossly normal mood and affect. Speech fluent and appropriate.   I have personally reviewed the following:  Urine output poorly documented. Labs:  Blood sugars are stable.  BUN slightly higher, 63. Creatinine stable, 3.17. Potassium within normal limits. Albumin stable, 2.9.  Second peritoneal fluid culture no growth, 4 days.    Scheduled Meds: . feeding supplement (ENSURE ENLIVE)  237 mL Oral TID BM  . insulin aspart  0-9 Units Subcutaneous TID WC  . spironolactone  100 mg Oral Daily  . traZODone  50 mg Oral QHS   Continuous Infusions: . albumin human 25 g (07/20/17 2300)  . cefTRIAXone (ROCEPHIN)  IV Stopped  (07/22/17 2330)  . furosemide Stopped (07/23/17 1033)    Principal  Problem:   Cirrhosis of liver with ascites (HCC) Active Problems:   Ascites due to alcoholic cirrhosis (HCC)   Thrombocytopenia (HCC)   Pleural effusion, left   SBP (spontaneous bacterial peritonitis) (HCC)   Hypoalbuminemia   Severe malnutrition (HCC)   AKI (acute kidney injury) (HCC)   Protein-calorie malnutrition, severe   History of alcohol abuse   Hyponatremia   LOS: 7 days

## 2017-07-24 ENCOUNTER — Encounter (HOSPITAL_COMMUNITY): Payer: Self-pay | Admitting: Family Medicine

## 2017-07-24 DIAGNOSIS — D890 Polyclonal hypergammaglobulinemia: Secondary | ICD-10-CM

## 2017-07-24 HISTORY — DX: Polyclonal hypergammaglobulinemia: D89.0

## 2017-07-24 LAB — RENAL FUNCTION PANEL
Albumin: 3.1 g/dL — ABNORMAL LOW (ref 3.5–5.0)
Anion gap: 9 (ref 5–15)
BUN: 67 mg/dL — AB (ref 6–20)
CHLORIDE: 103 mmol/L (ref 101–111)
CO2: 21 mmol/L — AB (ref 22–32)
Calcium: 8.9 mg/dL (ref 8.9–10.3)
Creatinine, Ser: 3.24 mg/dL — ABNORMAL HIGH (ref 0.61–1.24)
GFR calc Af Amer: 23 mL/min — ABNORMAL LOW (ref >60)
GFR calc non Af Amer: 20 mL/min — ABNORMAL LOW (ref >60)
GLUCOSE: 127 mg/dL — AB (ref 65–99)
POTASSIUM: 4.8 mmol/L (ref 3.5–5.1)
Phosphorus: 4.5 mg/dL (ref 2.5–4.6)
Sodium: 133 mmol/L — ABNORMAL LOW (ref 135–145)

## 2017-07-24 LAB — GLUCOSE, CAPILLARY
GLUCOSE-CAPILLARY: 116 mg/dL — AB (ref 65–99)
GLUCOSE-CAPILLARY: 133 mg/dL — AB (ref 65–99)
GLUCOSE-CAPILLARY: 160 mg/dL — AB (ref 65–99)
Glucose-Capillary: 126 mg/dL — ABNORMAL HIGH (ref 65–99)
Glucose-Capillary: 155 mg/dL — ABNORMAL HIGH (ref 65–99)

## 2017-07-24 LAB — CULTURE, BODY FLUID-BOTTLE: CULTURE: NO GROWTH

## 2017-07-24 MED ORDER — TORSEMIDE 20 MG PO TABS: 80.0000 mg | ORAL_TABLET | Freq: Every day | ORAL | Status: DC

## 2017-07-24 MED ORDER — TORSEMIDE 20 MG PO TABS
80.0000 mg | ORAL_TABLET | Freq: Every day | ORAL | Status: DC
  Administered 2017-07-24: 80 mg via ORAL
  Filled 2017-07-24: qty 4

## 2017-07-24 MED ORDER — SULFAMETHOXAZOLE-TRIMETHOPRIM 800-160 MG PO TABS
1.0000 | ORAL_TABLET | Freq: Every day | ORAL | Status: DC
  Administered 2017-07-24 – 2017-07-26 (×3): 1 via ORAL
  Filled 2017-07-24 (×3): qty 1

## 2017-07-24 MED ORDER — POLYETHYLENE GLYCOL 3350 17 G PO PACK
17.0000 g | PACK | Freq: Two times a day (BID) | ORAL | Status: DC
  Administered 2017-07-24 – 2017-08-01 (×12): 17 g via ORAL
  Filled 2017-07-24 (×16): qty 1

## 2017-07-24 MED ORDER — SPIRONOLACTONE 25 MG PO TABS: 100.0000 mg | ORAL_TABLET | Freq: Every day | ORAL | Status: DC

## 2017-07-24 NOTE — Progress Notes (Signed)
  Assessment/Plan: ADMITTED WITH SBP. SPEP/UPEP POLYCLONAL GAMMOPATHY BUT NO M-SPIKE. ON DAY 8 ROCEPHIN AND CLINICALLY IMPROVED.  PLAN: 1. DISCUSSED WITH PHARMACY(BENNIE). ADD BACTRIM DS DAILY FOR SBP PROPHYLAXIS(RISK OF SBP IN NEXT YEAR ~70%: CHILD PUGH C, Cr > 1.2) 2. Large volume paracentesis SEP 4. ALBUMIN BEING GIVEN DAILY. HOLD DIURETICS ON SEP 4.  3. CONTINUE A LOW SALT DIET.    Subjective: Since I last evaluated the patient HE HAS BECOME DISTDENED AFTER LVP x2. No questions or concerns except he would like more cranberry juice. Pain improve but belly distended.   Objective: Vital signs in last 24 hours: Vitals:   07/23/17 2227 07/24/17 0547  BP: (!) 102/57 (!) 101/59  Pulse: 86 95  Resp: 16 18  Temp: 98.2 F (36.8 C) 98.1 F (36.7 C)  SpO2: 100% 98%   General appearance: alert, cooperative and no distress Resp: clear to auscultation bilaterally Cardio: regular rate and rhythm GI: soft, non-tender; DISTENDED, bowel sounds normal;  Extremities: edema 3-4+ bilateral lower extremities  Lab Results:  Na 133 K 4.8 Cr 3.24 ALBUMIN 3.1   Studies/Results: No results found.  Medications: I have reviewed the patient's current medications.   LOS: 5 days   Jonette EvaSandi Wilmer Santillo 05/01/2014, 2:23 PM

## 2017-07-24 NOTE — Care Management Note (Signed)
Case Management Note  Patient Details  Name: Shawn JourdainDavid L Corkery MRN: [id number] Date of Birth: 05/20/1961  Expected Discharge Date:  07/17/17               Expected Discharge Plan:  Home/Self Care  In-House Referral:  NA  Discharge planning Services  CM Consult  Post Acute Care Choice:  NA Choice offered to:  NA  DME Arranged:  [place] rolling, Hospital bed DME Agency:  [facility]  [facility] Arranged:    [facility] Agency:     Status of Service:  In process, will continue to follow  Additional Comments: CM made visit with pt. Pt will need RW and hospital bed for safety at home. Pt has no preference of provider. Olegario MessierKathy, [facility] rep, aware of referral and will obtain pt info from chart. Discussed support after DC. Pt has a 'handy man' that he trusts to look in on him and help with things. (He does not trust him with his wallet.) Pt's daughter lives 2 hrs aware and will not be support person for him after DC. Pt will f/u with nephro and GI. CM will attempt to [facility] (today is holiday). Pt encouraged to apply for disability. He states a year ago he did not complete the application for medicaid because 'they asked to many questions, wanting to know how many cars I have, how many houses I have. It's none of their business". CM encouraged pt to be honest, those questions are ment to disqualify ppl who do not need assistance, they are not ment to be nosy. Pt [place] near the hospital because he has issues with transportation. CM will discuss transportation options with pt more prior to DC. Plan for pt to get bi-weekly paracentesis after DC. Pt anxious about what time he will get LVP tomorrow. Anticipate DC home tomorrow after LVP. Pt with low health literacy, [facility] greatly [facility] but is not a candidate with no PCP. CM will contact [facility] para-medicine program on 9/4 to see if they will follow after DC. Cirrhosis  is not a diagnosis they routinly follow but CM will ask for an exception. CM will cont to follow.    Malcolm Metrohildress, Priyanka Causey Demske, RN 07/24/2017, 2:01 PM

## 2017-07-24 NOTE — Care Management (Signed)
Patient Information   Patient Name Shawn [name], Shawn [name] ([id number]) Sex Male DOB 05/28/1961  Room Bed  A336 A336-01  Patient Demographics   Address [address] [place] [address] Phone [phone] (Home)  Patient Ethnicity & Race   Ethnic Group Patient Race  Not Hispanic or Latino Black or African American  Emergency Contact(s)   Name Relation Home Work Mobile  Pocahontasapuano,Luisa Friend [phone]    Documents on File    Status Date Received Description  Documents for the Patient  [place] HIPAA NOTICE OF PRIVACY - Scanned Not Received    [facility] E-Signature HIPAA Notice of Privacy Received 02/11/12   [employer] E-Signature HIPAA Notice of Privacy Spanish     Driver's License Not Received    Insurance Card Received 07/16/17 [employer] Benefits 2018  Advance Directives/Living Will/HCPOA/POA Not Received    Financial Application Not Received    Insurance Card  02/11/12 old card  Other Photo ID Received 07/16/17 [place] Photo ID exp 12/2023  Consent Form   paracentesis  Patient Photo   Photo of Patient  Documents for the Encounter  AOB (Assignment of Insurance Benefits) Not Received    E-signature AOB Signed 07/16/17   MEDICARE RIGHTS Not Received    E-signature Medicare Rights     Ultrasound  07/17/17   ED Patient Billing Extract   ED PB Summary  Consent Form  07/21/17   EKG (Deleted) 07/17/17   EKG  07/17/17   Consent Form   paracentesis  Admission Information   Attending Provider Admitting Provider Admission Type Admission Date/Time  Standley BrookingGoodrich, Daniel P, MD Meredeth IdeLama, Gagan S, MD Emergency 07/16/17 1524  Discharge Date Hospital Service Auth/Cert Status Service Area   Internal Medicine Incomplete  SERVICE AREA  Unit Room/Bed Admission Status   AP-DEPT 300 A336/A336-01 Admission (Confirmed)   Admission   Complaint  weakness  Hospital Account   Name Acct ID Class Status Primary Coverage  Shawn [name], Shawn [name] [id number] Inpatient Open PHCS MULTIPLAN -  MULTIPLAN PHCS      Guarantor Account (for Hospital Account [id number])   Name Relation to Pt Service Area Active? Acct Type  Shawn [name], Shawn [name] Self CHSA Yes Personal/Family  Address Phone    [address] [place], [place] [address] [phone](H)        Coverage Information (for Hospital Account [id number])   F/O Payor/Plan Precert #  [facility]   Subscriber Subscriber #  Shawn [name], Shawn [name] [id number]  Address Phone  [address] [address] [place], [place] [id number] [phone]

## 2017-07-24 NOTE — Progress Notes (Addendum)
PROGRESS NOTE  KYSON KUPPER ESP:[id number] DOB: 23-Jun-1961 DOA: 07/16/2017 PCP: Patient, No Pcp Per  Brief Narrative: 56 year old man PMH alcohol abuse, presented with generalized weakness, abdominal distention. Admitted for massive ascites secondary to alcoholic cirrhosis.  Assessment/Plan Decompensated alcoholic cirrhosis with associated thrombocytopenia and massive volume overload. Reportedly stopped drinking alcohol four months prior. Status post large volume paracentesis 8/27, 8/29, 8/31. -Main issue is rapidly recurring ascites. Respiratory status remained stable. Plan large-volume paracentesis limited to 8 L, 9/4.  -MELD 29 -Prognosis remains guarded. Outpatient follow-up with gastroenterology planned. -Palliative care consultation 9/4  Acute kidney injury. No previous records available. -Creatinine without dramatic change since August 29. Discussed with nephrology. May be new baseline for the patient. Plan today is to change Lasix to oral torsemide, continue spironolactone. If renal function and urine output are acceptable, likely discharge in the next 48 hours with outpatient follow-up. -Etiology remains clear, oncology did not feel plasma cell dyscrasia most likely. Skeletal survey was unremarkable.  SBP. Cultures no growth, final. -Change to oral suppressive therapy today per gastroenterology (Bactrim).  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 -Follow up with oncology as an outpatient. Could consider bone marrow biopsy  Diabetes mellitus type 2. -Stable. Continue sliding scale insulin.   Alcohol abuse in remission  Left pleural effusion. -Asymptomatic.  Severe malnutrition -B12 and folate levels adequate    Appears to be stabilizing. Plan large-volume paracentesis tomorrow. If renal function stable may be able to go home. Consult physical therapy. Will need to arrange for outpatient recurrent paracenteses.  DVT prophylaxis: SCDs Code Status:  DNR Family Communication: none Disposition Plan: home    Murray Hodgkins, MD  [facility] Direct contact: [phone] --Via [place]  --[email or web address]; password [email or web address]  7PM-7AM contact night coverage as above 07/24/2017, 1:25 PM  LOS: 8 days   Consultants:  Gastroenterology  Nephrology  Procedures:  Large volume paracentesis 8/27  Large volume paracentesis 8/29  Large volume paracentesis 8/31  Antimicrobials:  Ceftriaxone 8/26 >>  Interval history/Subjective: Slept better last night. Ate 2 bananas yesterday. Fluid is increasing in the abdomen. Breathing fine today. Constipated.  Objective: Vitals: afebrile. 98.1, 18, 95, 101/59, 95% on room  Exam:     Constitutional. Appears calm and comfortable.  Psychiatric. Grossly normal mood and affect. Speech fluent and appropriate.  Respiratory. Clear to auscultation bilaterally. No wheezes, rales or rhonchi. Normal respiratory effort.  Cardiovascular. Regular rate and rhythm. No murmur, rub or gallop. 2+ bilateral lower extremity edema. Perhaps modest improvement.  Abdomen is soft, nontender, more distended today.   I have personally reviewed the following:  Urine output not clearly documented Labs:  Capillary blood sugars are stable.  BUN is slowly trending up, 67. Creatinine slowly increasing but not much change 8/29. Albumin is trending up, 3.1.   Scheduled Meds: . feeding supplement (ENSURE ENLIVE)  237 mL Oral TID BM  . insulin aspart  0-9 Units Subcutaneous TID WC  . polyethylene glycol  17 g Oral BID  . [START ON 07/26/2017] spironolactone  100 mg Oral Daily  . sulfamethoxazole-trimethoprim  1 tablet Oral Daily  . [START ON 07/26/2017] torsemide  80 mg Oral Daily  . traZODone  50 mg Oral QHS   Continuous Infusions:   Principal Problem:   Cirrhosis of liver with ascites (HCC) Active Problems:   Ascites due to alcoholic cirrhosis (HCC)   Thrombocytopenia (HCC)   Pleural effusion, left   SBP  (spontaneous bacterial peritonitis) (HCC)   Hypoalbuminemia   Severe malnutrition (  [place])   AKI (acute kidney injury) ([place])   Protein-calorie malnutrition, severe   History of alcohol abuse   Polyclonal gammopathy   LOS: 8 days   Time spent: Greater than 35 minutes, greater than 50% in counseling and coordination of care. Discussed recommendations with patient. Discussed care with the nephrologist and gastroenterologist.

## 2017-07-24 NOTE — Progress Notes (Signed)
Subjective: Interval History: Patient denies any difficulty breathing but feels weak. He doesn't have any nausea or vomiting.  Objective: Vital signs in last 24 hours: Temp:  [98 F (36.7 C)-98.2 F (36.8 C)] 98.1 F (36.7 C) (09/03 0547) Pulse Rate:  [86-95] 95 (09/03 0547) Resp:  [16-18] 18 (09/03 0547) BP: (90-102)/(50-59) 101/59 (09/03 0547) SpO2:  [95 %-100 %] 95 % (09/03 0805) Weight change:   Intake/Output from previous day: 09/02 0701 - 09/03 0700 In: 592 [P.O.:360; IV Piggyback:232] Out: 400 [Urine:400] Intake/Output this shift: Total I/O In: 466 [IV Piggyback:466] Out: -   General appearance: alert, cooperative and no distress Resp: diminished breath sounds bilaterally Cardio: regular rate and rhythm GI: Distended, nontender Extremities: edema 2+ edema bilaterally  Lab Results:  Recent Labs  07/22/17 0538  WBC 7.3  HGB 8.7*  HCT 24.8*  PLT 57*   BMET:   Recent Labs  07/23/17 0415 07/24/17 0609  NA 134* 133*  K 4.7 4.8  CL 105 103  CO2 20* 21*  GLUCOSE 118* 127*  BUN 63* 67*  CREATININE 3.17* 3.24*  CALCIUM 8.6* 8.9   No results for input([name]): PTH in the last 72 hours. Iron Studies:  No results for input([name]): IRON, TIBC, TRANSFERRIN, FERRITIN in the last 72 hours.  Studies/Results: Dg Bone Survey Met  Result Date: 07/22/2017 CLINICAL DATA:  56 year old with a polyclonal gammopathy and generalized bone pain. Current history of alcoholic cirrhosis an acute renal insufficiency. EXAM: METASTATIC BONE SURVEY COMPARISON:  Chest x-ray 07/16/2017. Bone window images from CT abdomen and pelvis 07/16/2017. Left foot x-rays 09/04/2004. FINDINGS: The following bones were imaged: Lateral skull:  No lytic lesions. AP views of both shoulders: No lytic lesions. Mild degenerative changes involving both acromioclavicular joints. AP views of both humeri:  No lytic lesions or endosteal scalloping. AP views of both forearms: No lytic lesions or endosteal scalloping.  Cervical spine, AP and cross-table lateral views: Straightening of the usual cervical lordosis. Visualization only through the upper endplate of C6 on the lateral view. No lytic lesions. Thoracic spine, AP and cross-table lateral views: Cross-table lateral image is underexposed, though the AP image is diagnostic. No lytic lesions. Mild degenerative disc disease and spondylosis involving the mid and lower thoracic spine. Lumbar spine, AP and cross-table lateral views: 5 non-rib-bearing lumbar vertebrae with anatomic alignment. No lytic lesions. Severe disc space narrowing at L5-S1. Moderate disc space narrowing at L4-5. PA chest: No visible lytic lesions involving the ribs. Cardiac silhouette mildly enlarged, unchanged. Elevation of the right hemidiaphragm to shown on the recent CT to be secondary to a large amount of ascites. Small left pleural effusion with associated passive atelectasis in the left lower lobe, unchanged. Pulmonary venous hypertension without overt edema. AP pelvis: No lytic lesions. Well-preserved joint spaces in both hips. Sacroiliac joints and symphysis pubis intact. AP views of both femurs:  No lytic lesions or endosteal scalloping. Lateral views of both tibia-fibula: No lytic lesions or endosteal scalloping. IMPRESSION: No evidence of multiple myeloma or other generalized bone disorder. Electronically Signed   By: Evangeline Dakin M.D.   On: 07/22/2017 15:42    I have reviewed the patient'[name] current medications.  Assessment/Plan: Problem #1 renal failure: Most likely chronic . Etiology not clear but hypertension/ ATN/paraprothenmia . His renal function slightly increasing. He remained in stage IV. Problem #2 history of liver cirrhosis: Patient with significant anasarca. Patient presently on albumin, IV Lasix and Aldactone. He is still edema and ascites. But she denies any difficulty  breathing. Problem #3 anemia: His hemoglobin is below target goal but stable. Problem #4 hypertension:  His blood pressure is reasonably controlled Problem #5 history of diabetes Problem #6 bone and mineral disorder: His calcium and phosphorus is in range. 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 Problem #8 Patient'[name] Serum IFE shows Polyclonal gammopathy and urine IFE reveals elevated Free light chain excretion rate and elevated Kappa to Lambda ratio.  Plan: 1] will DC Lasix 2]Renal panel in am 3] will start on Demadex 80 mg once a day   LOS: 8 days   Shawn [name] 07/24/2017,10:31 AM

## 2017-07-25 ENCOUNTER — Inpatient Hospital Stay (HOSPITAL_COMMUNITY): Payer: No Typology Code available for payment source

## 2017-07-25 LAB — GLUCOSE, CAPILLARY
GLUCOSE-CAPILLARY: 137 mg/dL — AB (ref 65–99)
GLUCOSE-CAPILLARY: 114 mg/dL — AB (ref 65–99)
Glucose-Capillary: 132 mg/dL — ABNORMAL HIGH (ref 65–99)
Glucose-Capillary: 127 mg/dL — ABNORMAL HIGH (ref 65–99)

## 2017-07-25 LAB — CBC
HEMATOCRIT: 25.4 % — AB (ref 39.0–52.0)
Hemoglobin: 9 g/dL — ABNORMAL LOW (ref 13.0–17.0)
MCH: 34.4 pg — ABNORMAL HIGH (ref 26.0–34.0)
MCHC: 35.4 g/dL (ref 30.0–36.0)
MCV: 96.9 fL (ref 78.0–100.0)
PLATELETS: 70 10*3/uL — AB (ref 150–400)
RBC: 2.62 MIL/uL — ABNORMAL LOW (ref 4.22–5.81)
RDW: 16.4 % — ABNORMAL HIGH (ref 11.5–15.5)
WBC: 8.9 10*3/uL (ref 4.0–10.5)

## 2017-07-25 LAB — RENAL FUNCTION PANEL
ALBUMIN: 3.1 g/dL — AB (ref 3.5–5.0)
Anion gap: 10 (ref 5–15)
BUN: 74 mg/dL — AB (ref 6–20)
CALCIUM: 9.2 mg/dL (ref 8.9–10.3)
CO2: 19 mmol/L — ABNORMAL LOW (ref 22–32)
CREATININE: 3.41 mg/dL — AB (ref 0.61–1.24)
Chloride: 106 mmol/L (ref 101–111)
GFR calc Af Amer: 22 mL/min — ABNORMAL LOW (ref >60)
GFR, EST NON AFRICAN AMERICAN: 19 mL/min — AB (ref >60)
GLUCOSE: 131 mg/dL — AB (ref 65–99)
PHOSPHORUS: 5 mg/dL — AB (ref 2.5–4.6)
Potassium: 5 mmol/L (ref 3.5–5.1)
SODIUM: 135 mmol/L (ref 135–145)

## 2017-07-25 LAB — FOLATE RBC
Folate, Hemolysate: >620 ng/mL
Folate, RBC: >2573 ng/mL (ref >498)
Hematocrit: 24.1 % — ABNORMAL LOW (ref 37.5–51.0)

## 2017-07-25 MED ORDER — SPIRONOLACTONE 25 MG PO TABS
50.0000 mg | ORAL_TABLET | Freq: Every day | ORAL | Status: DC
  Administered 2017-07-26: 50 mg via ORAL
  Filled 2017-07-25: qty 2

## 2017-07-25 MED ORDER — ALBUMIN HUMAN 25 % IV SOLN
12.5000 g | Freq: Once | INTRAVENOUS | Status: AC
Start: 2017-07-25 — End: 2017-07-25
  Administered 2017-07-25: 12.5 g via INTRAVENOUS
  Filled 2017-07-25: qty 50

## 2017-07-25 MED ORDER — TORSEMIDE 20 MG PO TABS
100.0000 mg | ORAL_TABLET | Freq: Every day | ORAL | Status: DC
  Administered 2017-07-26 – 2017-08-04 (×10): 100 mg via ORAL
  Filled 2017-07-25 (×10): qty 5

## 2017-07-25 MED ORDER — ALBUMIN HUMAN 25 % IV SOLN
12.5000 g | Freq: Once | INTRAVENOUS | Status: AC
  Administered 2017-07-25: 12.5 g via INTRAVENOUS
  Filled 2017-07-25: qty 50

## 2017-07-25 NOTE — Progress Notes (Signed)
    Subjective: Feels groggy today. No confusion. Doesn't like the food. Not eating breakfast. Denies abdominal pain, N/V.   Objective: Vital signs in last 24 hours: Temp:  [97.9 F (36.6 C)-98.1 F (36.7 C)] 97.9 F (36.6 C) (09/04 0400) Pulse Rate:  [90-100] 100 (09/04 0400) Resp:  [20] 20 (09/03 2154) BP: (102-109)/(62) 102/62 (09/04 0400) SpO2:  [95 %-100 %] 95 % (09/04 0747) Last BM Date: 07/25/17 General:   Alert and oriented, pleasant, chronically ill appearing Abdomen:  Bowel sounds present, distended but soft with non-tense ascites Extremities:  With 3+ pitting lower extremity edema to hips, upper extremities with muscle wasting Neurologic:  Alert and  oriented x4, no asterixis on exam Psych:  Alert and cooperative. Normal mood and affect.  Intake/Output from previous day: 09/03 0701 - 09/04 0700 In: 706 [P.O.:240; IV Piggyback:466] Out: 2 [Urine:1; Stool:1] Intake/Output this shift: No intake/output data recorded.  Lab Results:  Recent Labs  07/25/17 0610  WBC 8.9  HGB 9.0*  HCT 25.4*  PLT 70*   BMET  Recent Labs  07/23/17 0415 07/24/17 0609 07/25/17 0605  NA 134* 133* 135  K 4.7 4.8 5.0  CL 105 103 106  CO2 20* 21* 19*  GLUCOSE 118* 127* 131*  BUN 63* 67* 74*  CREATININE 3.17* 3.24* 3.41*  CALCIUM 8.6* 8.9 9.2   LFT  Recent Labs  07/23/17 0415 07/24/17 0609 07/25/17 0605  ALBUMIN 2.9* 3.1* 3.1*   Lab Results  Component Value Date   INR 2.46 07/22/2017   INR 1.99 07/18/2017   INR 1.97 07/16/2017    Assessment: 56 year old male with decompensated cirrhosis, SBP, completing course of IV Rocephin. Now on Bactrim DS daily for SBP prophylaxis due to increased risk of recurrent SBP. For LVAP today, diuretics on hold today due to LVAP. Receiving IV albumin. Renal function continues to worsen, with diuretic therapy adjusted per nephrology. Poor prognosis overall.    Plan: HFP, INR tomorrow  LVAP today: diuretics on hold today Bactrim DS  once daily on board, continue as outpatient for SBP prophylaxis Diuretics per nephrology   Gelene MinkAnna W. Mckinzi Eriksen, PhD, ANP-BC [facility]     LOS: 9 days    07/25/2017, 8:07 AM

## 2017-07-25 NOTE — Progress Notes (Signed)
PROGRESS NOTE  Shawn [name] OXB:[id number] DOB: May 13, 1961 DOA: 07/16/2017 PCP: Patient, No Pcp Per  Brief Narrative: 56 year old man PMH alcohol abuse, presented with generalized weakness, abdominal distention. Admitted for massive ascites secondary to alcoholic cirrhosis. He was seen by gastroenterology in consultation. He was subsequently diagnosed with spontaneous bacterial peritonitis and treated with a full course of antibiotics and is now on suppressive therapy. Hospitalization has been complicated and prolonged by acute kidney injury which is slowly progressive and of unclear etiology. Further complicating matters is rapidly recurring ascites requiring multiple paracenteses.  Assessment/Plan Decompensated alcoholic cirrhosis with associated thrombocytopenia and massive volume overload. Reportedly stopped drinking alcohol four months prior. Status post large volume paracentesis 8/27, 8/29, 8/31, 9/4. -At this point main issue is rapidly recurring ascites. He will need scheduled outpatient paracenteses, probably 2 per week. -Prognosis remains guarded. Outpatient follow-up with gastroenterology as planned. MELD 29.  Acute kidney injury. No previous records available. -Creatinine trending upwards. Management per nephrology includes oral medications. Etiology remains unclear, oncology did not feel plasma cell dyscrasia was likely. Skeletal survey was unremarkable.  SBP. Cultures no growth, final. -Received a full course of antibiotics. Now on suppressive therapy.  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 -Follow up with oncology as an outpatient. Could consider bone marrow biopsy  Diabetes mellitus type 2. -Remains stable. Continue sliding scale insulin.  Alcohol abuse in remission  Left pleural effusion. -Asymptomatic.  Severe malnutrition -B12 and folate levels adequate    Unfortunately renal function worse today. No urinary for discharge until renal function is  stable.  DVT prophylaxis: SCDs Code Status: DNR Family Communication: none Disposition Plan: SNF?   Murray Hodgkins, MD  [facility] Direct contact: [phone] --Via amion app OR  --[email or web address]; password [email or web address]  7PM-7AM contact night coverage as above 07/25/2017, 3:53 PM  LOS: 9 days   Consultants:  Gastroenterology  Nephrology  Procedures:  Large volume paracentesis 8/27  Large volume paracentesis 8/29  Large volume paracentesis 8/31  Large-volume paracentesis 9/4  Antimicrobials:  Ceftriaxone 8/26 >> 9/3  Bactrim 9/3 suppressive therapy  Interval history/Subjective: Slept okay. Feels okay today.  Objective: Vitals: Afebrile, 98.3, 14, 92, 103/60, 99% on room air.  Exam:     Constitutional. Appears chronically ill, nontoxic.  Cardiovascular. Regular rate and rhythm. No murmur, rub or gallop. No change in 3+ bilateral lower extremity edema.  Respiratory. Clear to auscultation bilaterally. No wheezes, rales or rhonchi. Normal respiratory effort.  Abdomen. Soft, distended more so than yesterday. Nontender.   I have personally reviewed the following:  Urine output not recorded Labs:  Blood sugars stable.  BUN and creatinine both trending upwards, 74, 3.41.  Hemoglobin and platelet count stable, 9.0, 70. WBC 8.9.   Scheduled Meds: . feeding supplement (ENSURE ENLIVE)  237 mL Oral TID BM  . insulin aspart  0-9 Units Subcutaneous TID WC  . polyethylene glycol  17 g Oral BID  . [START ON 07/26/2017] spironolactone  50 mg Oral Daily  . sulfamethoxazole-trimethoprim  1 tablet Oral Daily  . [START ON 07/26/2017] torsemide  100 mg Oral Daily  . traZODone  50 mg Oral QHS   Continuous Infusions:   Principal Problem:   Cirrhosis of liver with ascites (HCC) Active Problems:   Ascites due to alcoholic cirrhosis (HCC)   Thrombocytopenia (HCC)   Pleural effusion, left   SBP (spontaneous bacterial peritonitis) (HCC)   Hypoalbuminemia   Severe  malnutrition (HCC)   AKI (acute kidney injury) ([place])   Protein-calorie malnutrition, severe  History of alcohol abuse   Polyclonal gammopathy   LOS: 9 days

## 2017-07-25 NOTE — Progress Notes (Signed)
Subjective: Interval History: Patient offers no new complaints. Still feels weak. His appetite is okay  Objective: Vital signs in last 24 hours: Temp:  [97.9 F (36.6 C)-98.1 F (36.7 C)] 97.9 F (36.6 C) (09/04 0400) Pulse Rate:  [90-100] 100 (09/04 0400) Resp:  [20] 20 (09/03 2154) BP: (102-109)/(62) 102/62 (09/04 0400) SpO2:  [95 %-100 %] 95 % (09/04 0747) Weight change:   Intake/Output from previous day: 09/03 0701 - 09/04 0700 In: 706 [P.O.:240; IV Piggyback:466] Out: 2 [Urine:1; Stool:1] Intake/Output this shift: No intake/output data recorded.  General appearance: alert, cooperative and no distress Resp: diminished breath sounds bilaterally Cardio: regular rate and rhythm GI: Distended, nontender Extremities: edema 2+ edema bilaterally  Lab Results:  Recent Labs  07/25/17 0610  WBC 8.9  HGB 9.0*  HCT 25.4*  PLT 70*   BMET:   Recent Labs  07/24/17 0609 07/25/17 0605  NA 133* 135  K 4.8 5.0  CL 103 106  CO2 21* 19*  GLUCOSE 127* 131*  BUN 67* 74*  CREATININE 3.24* 3.41*  CALCIUM 8.9 9.2   No results for input(s): PTH in the last 72 hours. Iron Studies:  No results for input(s): IRON, TIBC, TRANSFERRIN, FERRITIN in the last 72 hours.  Studies/Results: No results found.  I have reviewed the patient's current medications.  Assessment/Plan: Problem #1 renal failure: Most likely chronic . Etiology not clear but hypertension/ ATN/paraprothenmia . His creatinine continued to increase. Patient remained asymptomatic. Problem #2 history of liver cirrhosis: Patient with significant anasarca. Patient presently on albumin, IV Lasix and Aldactone. His urine output is not documented. Patient states that at times he becomes incontinent when sleeping. Problem #3 anemia: His hemoglobin is below target goal but stable. Problem #4 hypertension: His blood pressure is reasonably controlled Problem #5 history of diabetes: His blood sugar is reasonably  controlled Problem #6 bone and mineral disorder: His calcium and phosphorus is in range. 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 Problem #8 Patient's Serum IFE shows Polyclonal gammopathy and urine IFE reveals elevated Free light chain excretion rate and elevated Kappa to Lambda ratio. Plan: 1] will decrease Aldactone to 50 mg by mouth daily 2]Renal panel in am 3] will increase Demadex to 100 mg mg once a day   LOS: 9 days   Senaya Dicenso S 07/25/2017,8:54 AM

## 2017-07-25 NOTE — Procedures (Signed)
PreOperative Dx: Alcoholic cirrhosis, ascites Postoperative Dx: Alcoholic cirrhosis, ascites Procedure:   US guided paracentesis Radiologist:  Tyron RussellBoles Anesthesia:  10 ml of1% lidocaine Specimen:  6.1 L of yellow ascitic fluid EBL:   < 1 ml Complications: None

## 2017-07-25 NOTE — Care Management Note (Addendum)
Case Management Note  Patient Details  Name: Shawn JourdainDavid L Gressman MRN: [id number] Date of Birth: 03/08/1961  If discussed at Long Length of Stay Meetings, dates discussed:  07/25/2017  Additional Comments: [facility]. CM has assisted pt with filling out paperwork, CM will deliver [facility]. [facility].   Malcolm Metrohildress, Kachina Niederer Demske, RN 07/25/2017, 12:45 PM

## 2017-07-25 NOTE — Evaluation (Signed)
Physical Therapy Evaluation Patient Details Name: Shawn [name] MRN: [id number] DOB: 22-Nov-1960 Today's Date: 07/25/2017   History of Present Illness  Vonn Sliger  is a 56 y.o. male, With history of alcohol abuse, hypertension who came to hospital with complaints of generalized weakness and abdominal distention for past 3 months. Patient says that he quit drinking 4 months ago but he has been drinking almost all his life. Patient says he does not visit doctors and does not take any medications. He does have diabetes but has been taking care of diabetes by diet alone. He denies nausea vomiting or diarrhea. No fever. No chills. He complains of shortness of breath. No chest pain.    Clinical Impression  Patient demonstrates slow labored movement for sitting up at bedside, required Mod assist to stand due to BLE weakness, limited for taking steps and tolerated sitting up on [facility] in bathroom after therapy to have a BM - nursing staff notified.  Patient will benefit from continued physical therapy in hospital and recommended venue below to increase strength, balance, endurance for safe ADLs and gait.    Follow Up Recommendations SNF;Supervision/Assistance - 24 hour    Equipment Recommendations  Rolling walker with 5" wheels    Recommendations for Other Services       Precautions / Restrictions Precautions Precautions: Fall Restrictions Weight Bearing Restrictions: No      Mobility  Bed Mobility Overal bed mobility: Needs Assistance Bed Mobility: Supine to Sit;Sit to Supine     Supine to sit: Mod assist Sit to supine: Mod assist      Transfers Overall transfer level: Needs assistance Equipment used: Crutches Transfers: Sit to/from Stand Sit to Stand: Mod assist            Ambulation/Gait Ambulation/Gait assistance: Min assist Ambulation Distance (Feet): 25 Feet Assistive device: Crutches Gait Pattern/deviations: Decreased step length - left;Decreased step length -  right;Decreased stride length   Gait velocity interpretation: Below normal speed for age/gender General Gait Details: demonstrates slow unsteady cadence with 4 point gait pattern using axillary crutches  Stairs            Wheelchair Mobility    Modified Rankin (Stroke Patients Only)       Balance Overall balance assessment: Needs assistance Sitting-balance support: No upper extremity supported;Feet supported Sitting balance-Leahy Scale: Fair     Standing balance support: Bilateral upper extremity supported;During functional activity Standing balance-Leahy Scale: Poor                               Pertinent Vitals/Pain Pain Assessment: No/denies pain    Home Living Family/patient expects to be discharged to:: Private residence Living Arrangements: Alone Available Help at Discharge: Friend(s) Type of Home: House Home Access: Stairs to enter Entrance Stairs-Rails: Right Entrance Stairs-Number of Steps: 5 Home Layout: One level Home Equipment: Crutches      Prior Function Level of Independence: Independent with assistive device(s)               Hand Dominance        Extremity/Trunk Assessment   Upper Extremity Assessment Upper Extremity Assessment: Generalized weakness    Lower Extremity Assessment Lower Extremity Assessment: Generalized weakness    Cervical / Trunk Assessment Cervical / Trunk Assessment: Other exceptions Cervical / Trunk Exceptions: distended abdomen due to ascites  Communication   Communication: No difficulties  Cognition Arousal/Alertness: Awake/alert Behavior During Therapy: WFL for tasks assessed/performed Overall Cognitive  Status: Within Functional Limits for tasks assessed                                        General Comments      Exercises     Assessment/Plan    PT Assessment Patient needs continued PT services  PT Problem List Decreased strength;Decreased activity  tolerance;Decreased balance;Decreased mobility       PT Treatment Interventions Gait training;Stair training;Therapeutic activities;Therapeutic exercise    PT Goals (Current goals can be found in the Care Plan section)  Acute Rehab PT Goals Patient Stated Goal: Return home PT Goal Formulation: With patient Time For Goal Achievement: 08/01/17 Potential to Achieve Goals: Good    Frequency Min 3X/week   Barriers to discharge        Co-evaluation               AM-PAC PT "6 Clicks" Daily Activity  Outcome Measure Difficulty turning over in bed (including adjusting bedclothes, sheets and blankets)?: Unable Difficulty moving from lying on back to sitting on the side of the bed? : Unable Difficulty sitting down on and standing up from a chair with arms (e.g., wheelchair, bedside commode, etc,.)?: Unable Help needed moving to and from a bed to chair (including a wheelchair)?: A Lot Help needed walking in hospital room?: A Lot Help needed climbing 3-5 steps with a railing? : A Lot 6 Click Score: 9    End of Session Equipment Utilized During Treatment: Gait belt Activity Tolerance: Patient limited by fatigue Patient left: with call bell/phone within reach ([facility] in reach - nursing staff notified) Nurse Communication: Mobility status PT Visit Diagnosis: Unsteadiness on feet (R26.81);Other abnormalities of gait and mobility (R26.89);Muscle weakness (generalized) (M62.81)    Time: [id number] PT Time Calculation (min) (ACUTE ONLY): 35 min   Charges:   PT Evaluation $PT Eval Low Complexity: 1 Low PT Treatments $Therapeutic Activity: 23-37 mins   PT G Codes:        3:41 PM, 07/25/17 Ocie BobJames Preciosa Bundrick, MPT Physical Therapist with [facility] [phone] office [phone] mobile phone

## 2017-07-26 ENCOUNTER — Inpatient Hospital Stay (HOSPITAL_COMMUNITY): Payer: No Typology Code available for payment source

## 2017-07-26 LAB — GLUCOSE, CAPILLARY
GLUCOSE-CAPILLARY: 126 mg/dL — AB (ref 65–99)
GLUCOSE-CAPILLARY: 123 mg/dL — AB (ref 65–99)
GLUCOSE-CAPILLARY: 105 mg/dL — AB (ref 65–99)
Glucose-Capillary: 168 mg/dL — ABNORMAL HIGH (ref 65–99)

## 2017-07-26 LAB — COMPREHENSIVE METABOLIC PANEL
ALK PHOS: 37 U/L — AB (ref 38–126)
ALT: 10 U/L — ABNORMAL LOW (ref 17–63)
AST: 26 U/L (ref 15–41)
Albumin: 3.1 g/dL — ABNORMAL LOW (ref 3.5–5.0)
Anion gap: 12 (ref 5–15)
BUN: 79 mg/dL — AB (ref 6–20)
CHLORIDE: 102 mmol/L (ref 101–111)
CO2: 19 mmol/L — AB (ref 22–32)
CREATININE: 3.78 mg/dL — AB (ref 0.61–1.24)
Calcium: 9.1 mg/dL (ref 8.9–10.3)
GFR, EST AFRICAN AMERICAN: 19 mL/min — AB (ref >60)
GFR, EST NON AFRICAN AMERICAN: 16 mL/min — AB (ref >60)
Glucose, Bld: 137 mg/dL — ABNORMAL HIGH (ref 65–99)
POTASSIUM: 5.2 mmol/L — AB (ref 3.5–5.1)
SODIUM: 133 mmol/L — AB (ref 135–145)
Total Bilirubin: 1.9 mg/dL — ABNORMAL HIGH (ref 0.3–1.2)
Total Protein: 6.1 g/dL — ABNORMAL LOW (ref 6.5–8.1)

## 2017-07-26 LAB — BILIRUBIN, DIRECT: BILIRUBIN DIRECT: 0.6 mg/dL — AB (ref 0.1–0.5)

## 2017-07-26 LAB — PROTIME-INR
INR: 2.15
PROTHROMBIN TIME: 23.8 s — AB (ref 11.4–15.2)

## 2017-07-26 LAB — PHOSPHORUS: PHOSPHORUS: 5.2 mg/dL — AB (ref 2.5–4.6)

## 2017-07-26 MED ORDER — ALBUMIN HUMAN 25 % IV SOLN
12.5000 g | Freq: Once | INTRAVENOUS | Status: AC
  Administered 2017-07-26: 12.5 g via INTRAVENOUS
  Filled 2017-07-26: qty 50

## 2017-07-26 MED ORDER — CIPROFLOXACIN HCL 250 MG PO TABS
500.0000 mg | ORAL_TABLET | ORAL | Status: DC
  Administered 2017-07-27 – 2017-08-04 (×9): 500 mg via ORAL
  Filled 2017-07-26 (×9): qty 2

## 2017-07-26 NOTE — Progress Notes (Addendum)
PROGRESS NOTE  Shawn [name] CHY:[id number] DOB: 1961/02/16 DOA: 07/16/2017 PCP: Patient, No Pcp Per  Brief Narrative: 56 year old man PMH alcohol abuse, presented with generalized weakness, abdominal distention. Admitted for massive ascites secondary to alcoholic cirrhosis. He was seen by gastroenterology in consultation. He was subsequently diagnosed with spontaneous bacterial peritonitis and treated with a full course of antibiotics and is now on suppressive therapy. Hospitalization has been complicated and prolonged by acute kidney injury which is slowly progressive and of unclear etiology. Further complicating matters is rapidly recurring ascites requiring multiple paracenteses.  Assessment/Plan Decompensated alcoholic cirrhosis with associated thrombocytopenia and massive volume overload. Reportedly stopped drinking alcohol four months prior. Status post large volume paracentesis 8/27, 8/29, 8/31, 9/4. -At this point main issue is rapidly recurring ascites. He will need scheduled outpatient paracenteses, probably 2 per week. -Prognosis remains guarded. Outpatient follow-up with gastroenterology as planned. MELD 29. Will request palliative care for GOC   Acute kidney injury. No previous records available. -Creatinine trending upwards. Management per nephrology includes oral medications. Oncology did not feel plasma cell dyscrasia was likely. Skeletal survey was unremarkable. transfuse albumin Hold aldactone given elevated K  SBP. Cultures no growth, final. -Received a full course of antibiotics. Now on suppressive therapy. 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 -Follow up with oncology as an outpatient. Could consider bone marrow biopsy  Diabetes mellitus type 2. -Remains stable. Continue sliding scale insulin.  Alcohol abuse in  remission  Left pleural effusion. Sob , cxr today shows small B/L pleural effusions   Severe malnutrition -B12 and folate levels adequate       DVT prophylaxis: SCDs Code Status: DNR Family Communication: none Disposition Plan: SNF?     ,    Consultants:  Gastroenterology  Nephrology  Procedures:  Large volume paracentesis 8/27  Large volume paracentesis 8/29  Large volume paracentesis 8/31  Large-volume paracentesis 9/4  Antimicrobials:  Ceftriaxone 8/26 >> 9/3  Bactrim 9/3 suppressive therapy  Interval history/Subjective:  DOE , no chest pain  Objective: Vitals: Today's Vitals   07/25/17 1447 07/25/17 2107 07/25/17 2125 07/26/17 0629  BP: 105/67  110/64 (!) 106/56  Pulse: 90  88 (!) 101  Resp: _0 Temp:   98 F (36.7 C) 98 F (36.7 C)  TempSrc:   Oral Oral  SpO2: 99% 96% 98% 97%  Weight:    86.9 kg (191 lb 9.3 oz)  Height:      PainSc:         Exam:     Constitutional. Appears chronically ill, nontoxic.  Cardiovascular. Regular rate and rhythm. No murmur, rub or gallop. No change in 3+ bilateral lower extremity edema.  Respiratory. Clear to auscultation bilaterally. No wheezes, rales or rhonchi. Normal respiratory effort.  Abdomen. Soft, distended more so than yesterday. Nontender.       Scheduled Meds: . feeding supplement (ENSURE ENLIVE)  237 mL Oral TID BM  . insulin aspart  0-9 Units Subcutaneous TID WC  . polyethylene glycol  17 g Oral BID  . spironolactone  50 mg Oral Daily  . sulfamethoxazole-trimethoprim  1 tablet Oral Daily  . torsemide  100 mg Oral Daily  . traZODone  50 mg Oral QHS   Continuous Infusions: . albumin human      Principal Problem:   Cirrhosis of liver with ascites (HCC) Active Problems:   Ascites  due to alcoholic cirrhosis (HCC)   Thrombocytopenia (HCC)   Pleural effusion, left   SBP (spontaneous bacterial peritonitis) (HCC)   Hypoalbuminemia   Severe malnutrition (HCC)   AKI (acute  kidney injury) ([place])   Protein-calorie malnutrition, severe   History of alcohol abuse   Polyclonal gammopathy   LOS: 10 days

## 2017-07-26 NOTE — Progress Notes (Signed)
Subjective:  Complains of DOE. No lightheadedness. No abdominal pain. BM hard.   Objective: Vital signs in last 24 hours: Temp:  [98 F (36.7 C)-98.3 F (36.8 C)] 98 F (36.7 C) (09/05 0629) Pulse Rate:  [88-101] 101 (09/05 0629) Resp:  [14-18] 18 (09/05 0629) BP: (103-110)/(56-67) 106/56 (09/05 0629) SpO2:  [95 %-99 %] 97 % (09/05 0629) Weight:  [191 lb 9.3 oz (86.9 kg)] 191 lb 9.3 oz (86.9 kg) (09/05 0629) Last BM Date: 07/25/17 General:   Alert,  Cachetic appearing, male in NAD Head:  Normocephalic and atraumatic. Eyes:  Sclera clear, no icterus.  Chest: diminished breath sounds in the bases and scattered wheezing. No rales, rhonchi, crackles.    Heart:  Regular rate and rhythm; no murmurs, clicks, rubs,  or gallops. Abdomen:  Soft, nontender, +distended. Normal bowel sounds, without guarding, and without rebound.   Extremities:  Without clubbing, deformity. 1-2+ pitting edema to above the ankle Neurologic:  Alert and  oriented x4;  grossly normal neurologically. Skin:  Intact without significant lesions or rashes. Psych:  Alert and cooperative. Normal mood and affect.  Intake/Output from previous day: 09/04 0701 - 09/05 0700 In: 340 [P.O.:240; IV Piggyback:100] Out: 300 [Urine:300] Intake/Output this shift: No intake/output data recorded.  Lab Results: CBC  Recent Labs  07/25/17 0610  WBC 8.9  HGB 9.0*  HCT 25.4*  MCV 96.9  PLT 70*   BMET  Recent Labs  07/24/17 0609 07/25/17 0605 07/26/17 0905  NA 133* 135 133*  K 4.8 5.0 5.2*  CL 103 106 102  CO2 21* 19* 19*  GLUCOSE 127* 131* 137*  BUN 67* 74* 79*  CREATININE 3.24* 3.41* 3.78*  CALCIUM 8.9 9.2 9.1   LFTs  Recent Labs  07/24/17 0609 07/25/17 0605 07/26/17 0905  BILITOT  --   --  1.9*  BILIDIR  --   --  0.6*  ALKPHOS  --   --  37*  AST  --   --  26  ALT  --   --  10*  PROT  --   --  6.1*  ALBUMIN 3.1* 3.1* 3.1*   No results for input(s): LIPASE in the last 72 hours. PT/INR  Recent  Labs  07/26/17 0904  LABPROT 23.8*  INR 2.15      Imaging Studies: Ct Abdomen Pelvis Wo Contrast  Result Date: 07/16/2017 CLINICAL DATA:  Abdominal distention, weakness.  Heavy drinker. EXAM: CT ABDOMEN AND PELVIS WITHOUT CONTRAST TECHNIQUE: Multidetector CT imaging of the abdomen and pelvis was performed following the standard protocol without IV contrast. COMPARISON:  None. FINDINGS: Lower chest: Moderate left and small right pleural effusion. Patchy right lower lobe opacity, likely atelectasis, pneumonia not excluded. Patchy left lower lobe opacity, likely atelectasis. Hepatobiliary: Shrunken, nodular liver, reflecting a cirrhotic configuration. No focal hepatic lesion is seen. Gallbladder is contracted with multiple small gallstones (series 2/image 28). Pancreas: Within normal limits. Spleen: Normal in size. Adrenals/Urinary Tract: Adrenal glands are within normal limits. Kidneys are within normal limits.  No renal or ureteral calculi. Bladder is not discretely visualized. Stomach/Bowel: Stomach is within normal limits. Visualized bowel is grossly unremarkable. No evidence of bowel obstruction. Vascular/Lymphatic: No evidence of abdominal aortic aneurysm. Mild atherosclerotic calcifications. No gross abdominopelvic lymphadenopathy, although poorly evaluated. Reproductive: Prostate is unremarkable. Other: Large volume abdominopelvic ascites. Musculoskeletal: Mild degenerative changes of the visualized thoracolumbar spine, most prominent at L4-5. IMPRESSION: Cirrhosis.  No focal hepatic lesion is seen. Large volume abdominopelvic ascites. Moderate left and small right  pleural effusions. Patchy bilateral lower lobe opacities, likely atelectasis, right lower lobe pneumonia not excluded. Electronically Signed   By: Julian Hy M.D.   On: 07/16/2017 18:17   Dg Chest 2 View  Result Date: 07/16/2017 CLINICAL DATA:  Cough and weakness EXAM: CHEST  2 VIEW COMPARISON:  02/05/2015 FINDINGS: Cardiac  shadow is stable. The overall inspiratory effort is poor with bibasilar atelectatic changes. Left pleural effusion is noted similar to that seen on recent chest CT. No other focal abnormality is noted. IMPRESSION: Bibasilar atelectasis and left-sided pleural effusion. Electronically Signed   By: Inez Catalina M.D.   On: 07/16/2017 18:15   US Paracentesis  Result Date: 07/25/2017 INDICATION: Alcoholic cirrhosis, ascites EXAM: ULTRASOUND GUIDED THERAPEUTIC PARACENTESIS MEDICATIONS: None. COMPLICATIONS: None immediate. PROCEDURE: Procedure, benefits, and risks of procedure were discussed with patient. Written informed consent for procedure was obtained. Time out protocol followed. Adequate collection of ascites localized by ultrasound in LEFT lower quadrant. Skin prepped and draped in usual sterile fashion. Skin and soft tissues anesthetized with 10 mL of 1% lidocaine. 5 [place] Yueh catheter placed into peritoneal cavity. 6.1 L of yellow ascitic fluid aspirated by vacuum bottle suction. Patient reported onset of mild upper abdominal discomfort, the same feeling at associated with the previous paracentesis when he developed postprocedural hypotension ; at the patient's request, the procedure was terminated at this point. Procedure tolerated well by patient without immediate complication. FINDINGS: As above IMPRESSION: Successful ultrasound-guided paracentesis yielding 6.1 liters of peritoneal fluid. Electronically Signed   By: Lavonia Dana M.D.   On: 07/25/2017 15:58   US Paracentesis  Result Date: 07/21/2017 INDICATION: Alcoholic cirrhosis, ascites EXAM: ULTRASOUND GUIDED  PARACENTESIS MEDICATIONS: None. COMPLICATIONS: None immediate. PROCEDURE: Procedure, benefits, and risks of procedure were discussed with patient. Written informed consent for procedure was obtained. Time out protocol followed. Adequate collection of ascites localized by ultrasound in RIGHT lower quadrant. Skin prepped and draped in usual  sterile fashion. Skin and soft tissues anesthetized with 10 mL of 1% lidocaine. 5 [place] Yueh catheter placed into peritoneal cavity. 13.0 of clear yellow fluid aspirated by vacuum bottle suction. Procedure tolerated well by patient without immediate complication. FINDINGS: As above IMPRESSION: Successful ultrasound-guided paracentesis yielding 13 liters of peritoneal fluid. Electronically Signed   By: Lavonia Dana M.D.   On: 07/21/2017 14:55   US Paracentesis  Result Date: 07/19/2017 INDICATION: Symptomatic ascites with shortness of breath. Rapid reaccumulation after paracentesis yesterday. Request for additional drainage. Patient received IV albumin prior to the procedure. EXAM: ULTRASOUND GUIDED THERAPEUTIC PARACENTESIS MEDICATIONS: None. COMPLICATIONS: None immediate. PROCEDURE: Informed written consent was obtained from the patient after a discussion of the risks, benefits and alternatives to treatment. Patient was informed that he was at increased risk of bleeding due to liver dysfunction, elevated INR, and depressed platelets. He accepts this level of risk due to degree of symptoms. A timeout was performed prior to the initiation of the procedure. Initial ultrasound scanning demonstrates a massive amount of ascites within the left lower abdominal quadrant, with tense abdomen and shallow breathing. The left lower abdomen was prepped and draped in the usual sterile fashion. 1% lidocaine was used for local anesthesia. Following this, a 19 gauge, 7-cm, Yueh catheter was introduced. An ultrasound image was saved for documentation purposes. The paracentesis was performed. Due to patient's bleeding risk for repeat punctures a larger volume of drainage than before was needed to allow time for medical stabilization of the patient's ascites. Although there was additional ascites available for  withdrawal and the patient's blood pressure was stable, no additional drainage performed to ensure no adverse renal effects.  The catheter was removed and a dressing was applied. The patient tolerated the procedure well without immediate post procedural complication. FINDINGS: A total of approximately 10 L of yellow ascitic fluid was removed. IMPRESSION: Successful ultrasound-guided paracentesis yielding 10 liters of peritoneal fluid. Electronically Signed   By: Monte Fantasia M.D.   On: 07/19/2017 17:36   US Paracentesis  Result Date: 07/17/2017 INDICATION: Ascites. EXAM: ULTRASOUND GUIDED RIGHT PARACENTESIS MEDICATIONS: None. COMPLICATIONS: None immediate. PROCEDURE: Informed written consent was obtained from the patient after a discussion of the risks, benefits and alternatives to treatment. A timeout was performed prior to the initiation of the procedure. Initial ultrasound scanning demonstrates a large amount of ascites within the right lower abdominal quadrant. The right lower abdomen was prepped and draped in the usual sterile fashion. 1% lidocaine with epinephrine was used for local anesthesia. Following this, a Yuen catheter was introduced. An ultrasound image was saved for documentation purposes. The paracentesis was performed. The catheter was removed and a dressing was applied. The patient tolerated the procedure well without immediate post procedural complication. FINDINGS: A total of approximately 4.2 L of straw-colored fluid was removed. Samples were sent to the laboratory as requested by the clinical team. IMPRESSION: Successful ultrasound-guided paracentesis yielding 4.2 liters of peritoneal fluid. Electronically Signed   By: Lorriane Shire M.D.   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 Current history of alcoholic cirrhosis an acute renal insufficiency. EXAM: METASTATIC BONE SURVEY COMPARISON:  Chest x-ray 07/16/2017. Bone window images from CT abdomen and pelvis 07/16/2017. Left foot x-rays 09/04/2004.  FINDINGS: The following bones were imaged: Lateral skull:  No lytic lesions. AP views of both shoulders: No lytic lesions. Mild degenerative changes involving both acromioclavicular joints. AP views of both humeri:  No lytic lesions or endosteal scalloping. AP views of both forearms: No lytic lesions or endosteal scalloping. Cervical spine, AP and cross-table lateral views: Straightening of the usual cervical lordosis. Visualization only through the upper endplate of C6 on the lateral view. No lytic lesions. Thoracic spine, AP and cross-table lateral views: Cross-table lateral image is underexposed, though the AP image is diagnostic. No lytic lesions. Mild degenerative disc disease and spondylosis involving the mid and lower thoracic spine. Lumbar spine, AP and cross-table lateral views: 5 non-rib-bearing lumbar vertebrae with anatomic alignment. No lytic lesions. Severe disc space narrowing at L5-S1. Moderate disc space narrowing at L4-5. PA chest: No visible lytic lesions involving the ribs. Cardiac silhouette mildly enlarged, unchanged. Elevation of the right hemidiaphragm to shown on the recent CT to be secondary to a large amount of ascites. Small left pleural effusion with associated passive atelectasis in the left lower lobe, unchanged. Pulmonary venous hypertension without overt edema. AP pelvis: No lytic lesions. Well-preserved joint spaces in both hips. Sacroiliac joints and symphysis pubis intact. AP views of both femurs:  No lytic lesions or endosteal scalloping. Lateral views of both tibia-fibula: No lytic lesions or endosteal scalloping. IMPRESSION: No evidence of multiple myeloma or other generalized bone disorder. Electronically Signed   By: Evangeline Dakin M.D.   On: 07/22/2017 15:42  [2 weeks]   Assessment: 56 year old male with decompensated cirrhosis, SBP, completed course of IV Rocephin. Now on Bactrim DS daily for SBP prophylaxis due to increased risk of recurrent SBP. Total of four  LVAP  this admission resulting in over 30 liters of ascitic fluid removed. LE edema has significantly improved. Receiving IV albumin. Renal function without improvement, with diuretic therapy adjusted per nephrology. Poor prognosis overall.  Patient complains of SOB with getting onto bedside commode and with laying flat.   Plan: 1. cxr, 2 view. 2. F/u pending labs for today.  3. Continue Bactrim DS once daily indefinitely for SBP prophylaxis.   Laureen Ochs. Bernarda Caffey [facility] [phone] 9/5/20189:01 AM     LOS: 10 days    Addendum: Labs reviewed from today. MELD 31, up from 27 last week. CXR with improved aeration and residual bilateral pleural effusions.   Laureen Ochs. Bernarda Caffey [facility] [phone] 9/5/201810:27 AM

## 2017-07-26 NOTE — Progress Notes (Signed)
Physical Therapy Treatment Patient Details Name: Shawn JourdainDavid L [name] MRN: [id number] DOB: 04/15/1961 Today's Date: 07/26/2017    History of Present Illness Shawn LaymanDavid [name]  is a 56 y.o. male, With history of alcohol abuse, hypertension who came to hospital with complaints of generalized weakness and abdominal distention for past 3 months. Patient says that he quit drinking 4 months ago but he has been drinking almost all his life. Patient says he does not visit doctors and does not take any medications. He does have diabetes but has been taking care of diabetes by diet alone. He denies nausea vomiting or diarrhea. No fever. No chills. He complains of shortness of breath. No chest pain.    PT Comments    Patient demonstrates slightly increased endurance for gait training with FWW, limited secondary to c/o faitgue/mild SOB, required assistance to prop up on elbows during supine to sitting and tolerated staying up in chair after therapy.  Patient will benefit from continued physical therapy in hospital and recommended venue below to increase strength, balance, endurance for safe ADLs and gait.    Follow Up Recommendations  Supervision/Assistance - 24 hour;SNF     Equipment Recommendations  Rolling walker with 5" wheels    Recommendations for Other Services       Precautions / Restrictions Precautions Precautions: Fall Restrictions Weight Bearing Restrictions: No    Mobility  Bed Mobility Overal bed mobility: Needs Assistance Bed Mobility: Supine to Sit;Sit to Supine     Supine to sit: Min assist Sit to supine: Min assist      Transfers Overall transfer level: Needs assistance Equipment used: Rolling walker (2 wheeled) Transfers: Sit to/from Stand Sit to Stand: Min assist         General transfer comment: verbal cues for proper hand placement using RW  Ambulation/Gait Ambulation/Gait assistance: Min guard Ambulation Distance (Feet): 33 Feet Assistive device: Rolling walker (2  wheeled) Gait Pattern/deviations: Decreased step length - right;Decreased step length - left;Decreased stride length   Gait velocity interpretation: Below normal speed for age/gender General Gait Details: Demonstrates slow labored cadence without loss of balance, limited secondary to fatigue/SOB   Stairs            Wheelchair Mobility    Modified Rankin (Stroke Patients Only)       Balance Overall balance assessment: Needs assistance Sitting-balance support: No upper extremity supported;Feet supported Sitting balance-Leahy Scale: Fair     Standing balance support: Bilateral upper extremity supported;During functional activity Standing balance-Leahy Scale: Fair                              Cognition Arousal/Alertness: Awake/alert Behavior During Therapy: WFL for tasks assessed/performed Overall Cognitive Status: Within Functional Limits for tasks assessed                                        Exercises General Exercises - Lower Extremity Ankle Circles/Pumps: Seated;AROM;Both;10 reps Long Arc Quad: Seated;AROM;Both;10 reps Hip Flexion/Marching: Seated;AROM;Both;10 reps    General Comments        Pertinent Vitals/Pain Pain Assessment: No/denies pain    Home Living                      Prior Function            PT Goals (current goals can now be found  in the care plan section) Acute Rehab PT Goals Patient Stated Goal: Return home with assistance PT Goal Formulation: With patient Time For Goal Achievement: 08/01/17 Potential to Achieve Goals: Good Progress towards PT goals: Progressing toward goals    Frequency    Min 3X/week      PT Plan Current plan remains appropriate    Co-evaluation              AM-PAC PT "6 Clicks" Daily Activity  Outcome Measure  Difficulty turning over in bed (including adjusting bedclothes, sheets and blankets)?: Unable Difficulty moving from lying on back to sitting on  the side of the bed? : Unable Difficulty sitting down on and standing up from a chair with arms (e.g., wheelchair, bedside commode, etc,.)?: Unable Help needed moving to and from a bed to chair (including a wheelchair)?: A Lot Help needed walking in hospital room?: A Little Help needed climbing 3-5 steps with a railing? : A Lot 6 Click Score: 10    End of Session Equipment Utilized During Treatment: Gait belt Activity Tolerance: Patient tolerated treatment well;Patient limited by fatigue Patient left: in chair;with call bell/phone within reach Nurse Communication: Mobility status PT Visit Diagnosis: Unsteadiness on feet (R26.81);Other abnormalities of gait and mobility (R26.89);Muscle weakness (generalized) (M62.81)     Time: 1610-9604 PT Time Calculation (min) (ACUTE ONLY): 31 min  Charges:  $Therapeutic Activity: 23-37 mins                    G Codes:       11:58 AM, 10-Aug-2017 Ocie Bob, MPT Physical Therapist with [facility] [phone] office [phone] mobile phone

## 2017-07-26 NOTE — Progress Notes (Signed)
ARJEN DERINGER  MRN: [id number]  DOB/AGE: [birth year] 56 y.o.  Primary Care Physician:Patient, No Pcp Per  Admit date: 07/16/2017  Chief Complaint:  Chief Complaint  Patient presents with  . abdominal distension  . Weakness    S-Pt presented on  07/16/2017 with  Chief Complaint  Patient presents with  . abdominal distension  . Weakness  .    Pt offers no new complaints.       Meds . feeding supplement (ENSURE ENLIVE)  237 mL Oral TID BM  . insulin aspart  0-9 Units Subcutaneous TID WC  . polyethylene glycol  17 g Oral BID  . spironolactone  50 mg Oral Daily  . sulfamethoxazole-trimethoprim  1 tablet Oral Daily  . torsemide  100 mg Oral Daily  . traZODone  50 mg Oral QHS       Physical Exam: Vital signs in last 24 hours: Temp:  [98 F (36.7 C)-98.3 F (36.8 C)] 98 F (36.7 C) (09/05 0629) Pulse Rate:  [88-101] 101 (09/05 0629) Resp:  [14-18] 18 (09/05 0629) BP: (103-110)/(56-67) 106/56 (09/05 0629) SpO2:  [96 %-99 %] 97 % (09/05 0629) Weight:  [191 lb 9.3 oz (86.9 kg)] 191 lb 9.3 oz (86.9 kg) (09/05 0629) Weight change:  Last BM Date: 07/25/17  Intake/Output from previous day: 09/04 0701 - 09/05 0700 In: 340 [P.O.:240; IV Piggyback:100] Out: 300 [Urine:300] No intake/output data recorded.   Physical Exam: General- pt is awake,alert, oriented to time place and person Resp- No acute REsp distress, decreased at bases CVS- S1S2 regular in rate and rhythm GIT- BS+, soft, NT, ND EXT- 1+ LE Edema, No Cyanosis   Lab Results: CBC  Recent Labs  07/25/17 0610  WBC 8.9  HGB 9.0*  HCT 25.4*  PLT 70*    BMET  Recent Labs  07/24/17 0609 07/25/17 0605  NA 133* 135  K 4.8 5.0  CL 103 106  CO2 21* 19*  GLUCOSE 127* 131*  BUN 67* 74*  CREATININE 3.24* 3.41*  CALCIUM 8.9 9.2    Creat trend 2018  2.4=>2.8=>3.09=>3.4  MICRO Recent Results (from the past 240 hour(s))  Urine culture     Status: Abnormal   Collection Time: 07/17/17 12:10 AM   Result Value Ref Range Status   Specimen Description URINE, CLEAN CATCH  Final   Special Requests NONE  Final   Culture MULTIPLE SPECIES PRESENT, SUGGEST RECOLLECTION (A)  Final   Report Status 07/18/2017 FINAL  Final  Gram stain     Status: None   Collection Time: 07/17/17  9:20 AM  Result Value Ref Range Status   Specimen Description ASCITIC  Final   Special Requests NONE  Final   Gram Stain   Final    CYTOSPIN SMEAR NO ORGANISMS SEEN WBC SEEN WBC PRESENT,BOTH PMN AND MONONUCLEAR    Report Status 07/17/2017 FINAL  Final  Culture, body fluid-bottle     Status: None   Collection Time: 07/17/17  9:20 AM  Result Value Ref Range Status   Specimen Description ASCITIC COLLECTED BY DOCTOR  Final   Special Requests BOTTLES DRAWN AEROBIC AND ANAEROBIC 10 CC EACH  Final   Culture NO GROWTH 5 DAYS  Final   Report Status 07/22/2017 FINAL  Final  Gram stain     Status: None   Collection Time: 07/19/17  4:42 PM  Result Value Ref Range Status   Specimen Description PERITONEAL CAVITY  Final   Special Requests NONE  Final   Gram Stain  Final    Performed at [facility] WBC PRESENT,BOTH PMN AND MONONUCLEAR    Report Status 07/19/2017 FINAL  Final  Culture, body fluid-bottle     Status: None   Collection Time: 07/19/17  4:42 PM  Result Value Ref Range Status   Specimen Description ASCITIC COLLECTED BY DOCTOR  Final   Special Requests BOTTLES DRAWN AEROBIC AND ANAEROBIC 10 CC EACH  Final   Culture NO GROWTH 5 DAYS  Final   Report Status 07/24/2017 FINAL  Final      Lab Results  Component Value Date   CALCIUM 9.2 07/25/2017   PHOS 5.0 (H) 07/25/2017       Auto immune work up Hepb/ hepc/HIV-negative C-ANCA negative P-ANCA negative ANA negative ASO low Anti GBM negative Complements low   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                 AKI on  CKD ?               CKD stage not sure. not sure as not much data avaiable               CKD since not sure as not much data available               Pt did no have any labs for past 20+ years                ANCA vasculitis?                  AKI worsening                Creat trending up.                           Renal biopsy risks                INR high                PT high                Inability of pt to lie flat as abdomen distended                                 2)HTN  Medication- On RAS blockers-Spironolactone On Diuretics-Lasix.  3)Anemia HGb low Primary team following  4)Liver-admitted with liver cirrhosis Sec to ETOH abuse   5)Resp-admitted with pleural effusion PMD following  6)Electrolytes  Normokalemic  Hyponatremic Hypervolemia Hyponatremia sec to cirrhosis Now better  7)Acid base Co2 at goal  8) Hyperphosphatemia    Will follow    Plan:  Pt creat trending high. Will discuss with GI if any other antibiotics other than bactrim I did educate pt about worsening of kidney disease. Pt voiced understanding.        BHUTANI,MANPREET S 07/26/2017, 9:06 AM

## 2017-07-26 NOTE — Progress Notes (Signed)
Pharmacy Antibiotic Note  Shawn [name] is a 56 y.o. male admitted on 07/16/2017 with SBP prophylaxis.  Pharmacy has been consulted for Cipro dosing. 56 yo male who was admitted for massive ascites secondary to alcoholic cirrhosis. He was diagnosed with SBP and treated with full course of antibiotics and now on suppressive tx. He has had prolonged AKI and MD wants to transition to cipro for suppressive therapy.  Plan: Cipro 500mg  po daily F/U labs  Height: 5\' 9"  (175.3 cm) Weight: 191 lb 9.3 oz (86.9 kg) IBW/kg (Calculated) : 70.7  Temp (24hrs), Avg:98.1 F (36.7 C), Min:98 F (36.7 C), Max:98.3 F (36.8 C)   Recent Labs Lab 07/21/17 0534 07/22/17 0538 07/23/17 0415 07/24/17 0609 07/25/17 0605 07/25/17 0610 07/26/17 0905  WBC 7.8 7.3  --   --   --  8.9  --   CREATININE 3.15* 3.12* 3.17* 3.24* 3.41*  --  3.78*    Estimated Creatinine Clearance: 23.8 mL/min (A) (by C-G formula based on SCr of 3.78 mg/dL (H)).    No Known Allergies  Antimicrobials this admission: cipro 9/5>>  septra DS PTA >> 9/5  Thank you for allowing pharmacy to be a part of this patient's care.  Elder CyphersLorie Angelly Spearing, BS Pharm D, BCPS Clinical Pharmacist Pager [phone] 07/26/2017 11:10 AM

## 2017-07-27 ENCOUNTER — Encounter (HOSPITAL_COMMUNITY): Payer: Self-pay | Admitting: Primary Care

## 2017-07-27 ENCOUNTER — Inpatient Hospital Stay (HOSPITAL_COMMUNITY): Payer: No Typology Code available for payment source

## 2017-07-27 DIAGNOSIS — Z515 Encounter for palliative care: Secondary | ICD-10-CM

## 2017-07-27 DIAGNOSIS — R188 Other ascites: Secondary | ICD-10-CM

## 2017-07-27 DIAGNOSIS — Z7189 Other specified counseling: Secondary | ICD-10-CM

## 2017-07-27 LAB — COMPREHENSIVE METABOLIC PANEL
ALK PHOS: 34 U/L — AB (ref 38–126)
ALT: 9 U/L — AB (ref 17–63)
AST: 19 U/L (ref 15–41)
Albumin: 2.8 g/dL — ABNORMAL LOW (ref 3.5–5.0)
Anion gap: 9 (ref 5–15)
BUN: 87 mg/dL — AB (ref 6–20)
CALCIUM: 8.8 mg/dL — AB (ref 8.9–10.3)
CO2: 21 mmol/L — AB (ref 22–32)
CREATININE: 3.81 mg/dL — AB (ref 0.61–1.24)
Chloride: 105 mmol/L (ref 101–111)
GFR, EST AFRICAN AMERICAN: 19 mL/min — AB (ref >60)
GFR, EST NON AFRICAN AMERICAN: 16 mL/min — AB (ref >60)
Glucose, Bld: 90 mg/dL (ref 65–99)
Potassium: 5.4 mmol/L — ABNORMAL HIGH (ref 3.5–5.1)
Sodium: 135 mmol/L (ref 135–145)
Total Bilirubin: 1.9 mg/dL — ABNORMAL HIGH (ref 0.3–1.2)
Total Protein: 5.5 g/dL — ABNORMAL LOW (ref 6.5–8.1)

## 2017-07-27 LAB — GLUCOSE, CAPILLARY
GLUCOSE-CAPILLARY: 79 mg/dL (ref 65–99)
GLUCOSE-CAPILLARY: 108 mg/dL — AB (ref 65–99)
GLUCOSE-CAPILLARY: 138 mg/dL — AB (ref 65–99)
Glucose-Capillary: 122 mg/dL — ABNORMAL HIGH (ref 65–99)

## 2017-07-27 LAB — AMMONIA: AMMONIA: 50 umol/L — AB (ref 9–35)

## 2017-07-27 MED ORDER — LACTULOSE 10 GM/15ML PO SOLN
10.0000 g | Freq: Three times a day (TID) | ORAL | Status: DC
  Administered 2017-07-27 – 2017-08-04 (×16): 10 g via ORAL
  Filled 2017-07-27 (×21): qty 30

## 2017-07-27 MED ORDER — BISACODYL 10 MG RE SUPP: 10.0000 mg | Freq: Every day | RECTAL | Status: DC | PRN

## 2017-07-27 NOTE — Clinical Social Work Note (Signed)
Clinical Social Work Assessment  Patient Details  Name: Shawn [name] MRN: [id number] Date of Birth: 12/12/1960  Date of referral:  07/27/17               Reason for consult:  Discharge Planning                Permission sought to share information with:    Permission granted to share information::     Name::        Agency::     Relationship::     Contact Information:     Housing/Transportation Living arrangements for the past 2 months:  Single Family Home Source of Information:  Patient Patient Interpreter Needed:  None Criminal Activity/Legal Involvement Pertinent to Current Situation/Hospitalization:  No - Comment as needed Significant Relationships:  Significant Other, Adult Children Lives with:  Self Do you feel safe going back to the place where you live?  Yes Need for family participation in patient care:  Yes (Comment)  Care giving concerns: Patient was independent at baseline although he reports that he is having increased difficulty with self care.    Social Worker assessment / plan:  Patient lives alone. At baseline, patient uses a cane for ambulation. He does not drive. Patient completes ADLs unassisted. Patient states that he is interested in SNF if his insurance will pay for it.    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 Patient understands that he may have to go home if he does not have SNF benefits in his insurance.   Patient/Family's Understanding of and Emotional Response to Diagnosis, Current Treatment, and Prognosis:  Patient understands his diagnosis, treatment and prognosis.   Emotional Assessment Appearance:  Appears stated age Attitude/Demeanor/Rapport:    Affect (typically observed):   Accepting, Calm Orientation:  Oriented to Situation, Oriented to  Time, Oriented to Place, Oriented to Self Alcohol / Substance use:  Not Applicable Psych involvement (Current and /or in the community):  No (Comment)  Discharge Needs  Concerns to be addressed:  Discharge Planning Concerns Readmission within the last 30 days:  Yes Current discharge risk:  Lives alone, Chronically ill Barriers to Discharge:  No Barriers Identified   Annice NeedySettle, Shawn Wisnewski D, LCSW 07/27/2017, 10:23 AM

## 2017-07-27 NOTE — Progress Notes (Signed)
Physical Therapy Treatment Patient Details Name: Shawn JourdainDavid L [name] MRN: [id number] DOB: 10/15/1961 Today's Date: 07/27/2017    History of Present Illness Shawn LaymanDavid [name]  is a 56 y.o. male, With history of alcohol abuse, hypertension who came to hospital with complaints of generalized weakness and abdominal distention for past 3 months. Patient says that he quit drinking 4 months ago but he has been drinking almost all his life. Patient says he does not visit doctors and does not take any medications. He does have diabetes but has been taking care of diabetes by diet alone. He denies nausea vomiting or diarrhea. No fever. No chills. He complains of shortness of breath. No chest pain.    PT Comments    Patient declined functional activity today due to abdominal discomfort and feeling fatigued.  Patient agreeable for exercises in bed and required verbal cues to complete with good return demonstrated.   Follow Up Recommendations  Supervision/Assistance - 24 hour;SNF     Equipment Recommendations  Rolling walker with 5" wheels    Recommendations for Other Services       Precautions / Restrictions Precautions Precautions: Fall Restrictions Weight Bearing Restrictions: No    Mobility  Bed Mobility                  Transfers                    Ambulation/Gait                 Stairs            Wheelchair Mobility    Modified Rankin (Stroke Patients Only)       Balance                                            Cognition Arousal/Alertness: Awake/alert Behavior During Therapy: WFL for tasks assessed/performed Overall Cognitive Status: Within Functional Limits for tasks assessed                                        Exercises General Exercises - Lower Extremity Ankle Circles/Pumps: AROM;Both;15 reps;Supine Quad Sets: AROM;Both;Supine;10 reps Gluteal Sets: Both;Supine;AROM;10 reps Short Arc Quad:  Both;Supine;AROM;10 reps Hip ABduction/ADduction: Both;Supine;AROM;10 reps    General Comments        Pertinent Vitals/Pain Pain Assessment: No/denies pain    Home Living                      Prior Function            PT Goals (current goals can now be found in the care plan section) Acute Rehab PT Goals Patient Stated Goal: Return home with assistance PT Goal Formulation: With patient Time For Goal Achievement: 08/01/17 Potential to Achieve Goals: Good Progress towards PT goals: Progressing toward goals    Frequency    Min 3X/week      PT Plan Current plan remains appropriate    Co-evaluation              AM-PAC PT "6 Clicks" Daily Activity  Outcome Measure  Difficulty turning over in bed (including adjusting bedclothes, sheets and blankets)?: Unable Difficulty moving from lying on back to sitting on the side of the bed? : Unable Difficulty sitting down  on and standing up from a chair with arms (e.g., wheelchair, bedside commode, etc,.)?: Unable Help needed moving to and from a bed to chair (including a wheelchair)?: A Little Help needed walking in hospital room?: A Little Help needed climbing 3-5 steps with a railing? : A Lot 6 Click Score: 11    End of Session   Activity Tolerance: Patient limited by fatigue Patient left: in bed;with call bell/phone within reach Nurse Communication: Mobility status PT Visit Diagnosis: Unsteadiness on feet (R26.81);Other abnormalities of gait and mobility (R26.89);Muscle weakness (generalized) (M62.81)     Time: 1610-9604 PT Time Calculation (min) (ACUTE ONLY): 22 min  Charges:  $Therapeutic Exercise: 8-22 mins                    G Codes:      2:24 PM, 08-07-2017 Ocie Bob, MPT Physical Therapist with [facility] [phone] office [phone] mobile phone

## 2017-07-27 NOTE — [place] FL2 (Signed)
[place] MEDICAID FL2 LEVEL OF CARE SCREENING TOOL     IDENTIFICATION  Patient Name: Shawn [name] Birthdate: Oct 17, 1961 Sex: male Admission Date (Current Location): 07/16/2017  [facility] and [place] Number:  [employer] and Address:  [facility],  [address], [place] [address]      Provider Number: [id number]  Attending Physician Name and Address:  Richarda Overlie, MD  Relative Name and Phone Number:       Current Level of Care: Hospital Recommended Level of Care: Skilled Nursing Facility Prior Approval Number:    Date Approved/Denied:   PASRR Number:    Discharge Plan: SNF    Current Diagnoses: Patient Active Problem List   Diagnosis Date Noted  . 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 History of alcohol abuse   . Protein-calorie malnutrition, severe 07/18/2017  . Cirrhosis of liver with ascites (HCC)   . Thrombocytopenia (HCC) 07/17/2017  . Pleural effusion, left 07/17/2017  . SBP (spontaneous bacterial peritonitis) (HCC) 07/17/2017  . Hypoalbuminemia 07/17/2017  . Severe malnutrition (HCC) 07/17/2017  . AKI (acute kidney injury) (HCC) 07/17/2017  . Ascites due to alcoholic cirrhosis (HCC) 07/16/2017    Orientation RESPIRATION BLADDER Height & Weight     Self, Time, Situation  Normal Continent Weight: 191 lb 9.3 oz (86.9 kg) Height:   (175.3 cm)  BEHAVIORAL SYMPTOMS/MOOD NEUROLOGICAL BOWEL NUTRITION STATUS      Continent  (Heart Healthy)  AMBULATORY STATUS COMMUNICATION OF NEEDS Skin   Supervision Verbally Normal                       Personal Care Assistance Level of Assistance  Bathing, Feeding, Dressing Bathing Assistance: Limited assistance Feeding assistance: Independent Dressing Assistance: Limited assistance     Functional Limitations Info  Sight, Hearing, Speech Sight Info: Adequate Hearing Info: Adequate Speech Info: Adequate    SPECIAL CARE FACTORS FREQUENCY  PT (By licensed PT)     PT Frequency:  5x/week               Contractures Contractures Info: Not present    Additional Factors Info  Code Status Code Status Info: DNR             Current Medications (07/27/2017):  This is the current hospital active medication list Current Facility-Administered Medications  Medication Dose Route Frequency Provider Last Rate Last Dose  . ciprofloxacin (CIPRO) tablet 500 mg  500 mg Oral Q24H Abrol, Nayana, MD      . feeding supplement (ENSURE ENLIVE) (ENSURE ENLIVE) liquid 237 mL  237 mL Oral TID BM Standley Brooking, MD   237 mL at 07/25/17 2215  . insulin aspart (novoLOG) injection 0-9 Units  0-9 Units Subcutaneous TID WC Meredeth Ide, MD   2 Units at 07/26/17 1208  . ondansetron (ZOFRAN) tablet 4 mg  4 mg Oral Q6H PRN Meredeth Ide, MD       Or  . ondansetron (ZOFRAN) injection 4 mg  4 mg Intravenous Q6H PRN Meredeth Ide, MD      . polyethylene glycol (MIRALAX / GLYCOLAX) packet 17 g  17 g Oral BID Standley Brooking, MD   17 g at 07/26/17 2158  . torsemide (DEMADEX) tablet 100 mg  100 mg Oral Daily Salomon Mast, MD   100 mg at 07/26/17 0935  . traZODone (DESYREL) tablet 50 mg  50 mg Oral QHS Standley Brooking, MD   50 mg at 07/26/17 2158  Discharge Medications: Please see discharge summary for a list of discharge medications.  Relevant Imaging Results:  Relevant Lab Results:   Additional Information SSN [id number]19 1827  Aerianna Losey, Juleen ChinaHeather D, LCSW

## 2017-07-27 NOTE — Consult Note (Signed)
Consultation Note Date: 07/27/2017   Patient Name: Shawn [name]  DOB: 09-23-1961  MRN: [id number]  Age / Sex: 56 y.o., male  PCP: Patient, No Pcp Per Referring Physician: Richarda Overlie, MD  Reason for Consultation: Establishing goals of care and Psychosocial/spiritual support  HPI/Patient Profile: 56 y.o. male  with past medical history of Diabetes, and gout, hypertension, alcohol abuse, last drink 5 months ago admitted on 07/16/2017 with decompensated alcoholic cirrhosis with acute kidney injury, SBP.   Clinical Assessment and Goals of Care: Shawn [name] is resting quietly in bed. He greets me, making but not keeping eye contact. He is alert and oriented. No family is present at this time.  We talk about what the doctors have told him. Shawn [name] shares that kidney doctor tells him that his kidney function is either stable or a little worse daily. I ask if he has considered dialysis, and he looks away, doesn't answer.   Shawn [name] shares that one of the other physicians has told him, in essence, that he is dying. He states that he does not want to believe/accept this. He shares that one of the physicians has talked to him about hospice, but a friend shared that when a person goes to the hospice home it's, "like signing your death certificate". I share the police of hospice, and that when people choose hospice, there is a different focus. We talk about his multiple paracentesis during hospitalization. He shares that during the last paracentesis he was unable to continue after about 6 1/2 L. We talk about a Pleurex, permanent drain. Shawn [name] states that he would consider this if needed, but feels he would need assistance. I share that our goal is for him to go to rehab, but there is some difficulty with payer source. I share that we are doing our best to help him. During our visit Shawn [name], PT arrives to assist Shawn [name]  with exercise. I share that I will return tomorrow to talk more.  Healthcare power of attorney NEXT OF KIN - no legal power of attorney at this time. Adult daughter age 24, Shawn [name], lives in [place]. Estranged son lives in [place].   SUMMARY OF RECOMMENDATIONS   at this point, Shawn [name] goal is to continue with current treatments. His desire is for rehab, but this is complicated by insurance/payer source.  Code Status/Advance Care Planning:  DNR  Symptom Management:   per hospitalist and G.I., no additional needs at this time.  Palliative Prophylaxis:   Frequent Pain Assessment and Turn Reposition  Additional Recommendations (Limitations, Scope, Preferences):  At this point, treat the treatable but no extraordinary measures such as CPR or intubation.  Psycho-social/Spiritual:   Desire for further Chaplaincy support:no  Additional Recommendations: Caregiving  Support/Resources and Education on Hospice  Prognosis:   < 6 months, or less would not be surprising based on decreasing functional status, albumin of 1.8 on admission, worsening kidney function, 30 L of asitic fluid removed from abdomen during this admission.  Discharge  Planning: To be determined, Shawn [name] is agreeable to rehab, but at this point, there are no accepting facilities.      Primary Diagnoses: Present on Admission: . Ascites due to alcoholic cirrhosis (HCC) . Thrombocytopenia (HCC) . Pleural effusion, left . SBP (spontaneous bacterial peritonitis) (HCC) . Hypoalbuminemia . Severe malnutrition (HCC) . AKI (acute kidney injury) (HCC) . Protein-calorie malnutrition, severe   I have reviewed the medical record, interviewed the patient and family, and examined the patient. The following aspects are pertinent.  Past Medical History:  Diagnosis Date  . Alcohol abuse   . Diabetes mellitus without complication (HCC)   . Gout   . Hypertension   . Polyclonal gammopathy  07/24/2017   Social History   Social History  . Marital status: Single    Spouse name: N/A  . Number of children: N/A  . Years of education: N/A   Social History Main Topics  . Smoking status: Never Smoker  . Smokeless tobacco: Current User  . Alcohol use Yes     Comment: stopped drinking 3 months ago, but normally 6-10 beers a day.   . Drug use: No  . Sexual activity: Not Asked   Other Topics Concern  . None   Social History Narrative  . None   Family History  Problem Relation Age of Onset  . Colon cancer Neg Hx   . Colon polyps Neg Hx    Scheduled Meds: . ciprofloxacin  500 mg Oral Q24H  . feeding supplement (ENSURE ENLIVE)  237 mL Oral TID BM  . insulin aspart  0-9 Units Subcutaneous TID WC  . polyethylene glycol  17 g Oral BID  . torsemide  100 mg Oral Daily  . traZODone  50 mg Oral QHS   Continuous Infusions: PRN Meds:.ondansetron **OR** ondansetron (ZOFRAN) IV Medications Prior to Admission:  Prior to Admission medications   Medication Sig Start Date End Date Taking? Authorizing Provider  Multiple Vitamins-Minerals (MULTIVITAMIN ADULTS 50+) TABS Take 1 tablet by mouth daily.   Yes [provider]   No Known Allergies Review of Systems  Unable to perform ROS: Acuity of condition    Physical Exam  Constitutional: He is oriented to person, place, and time. No distress.  Appears frail and thin, chronically/acutely ill. Makes but does not keep eye contact  HENT:  Head: Atraumatic.  Temporal wasting  Cardiovascular: Normal rate and regular rhythm.   Pulmonary/Chest: Effort normal. No respiratory distress.  Abdominal: Soft. He exhibits distension.  Musculoskeletal: He exhibits no edema.  Feet with wrinkled skin like fluid has been removed, abdomen swollen with fluid  Neurological: He is alert and oriented to person, place, and time.  Skin: Skin is warm and dry.  Nursing note and vitals reviewed.   Vital Signs: BP (!) 99/52 (BP Location: Right  Arm)   Pulse 92   Temp 97.9 F (36.6 C) (Oral)   Resp 16   Ht  (1.753 m)   Wt 86.9 kg (191 lb 9.3 oz)   SpO2 100%   BMI 28.29 kg/m  Pain Assessment: No/denies pain   Pain Score: 0-No pain   SpO2: SpO2: 100 % O2 Device:SpO2: 100 % O2 Flow Rate: .O2 Flow Rate (L/min): 3 L/min  IO: Intake/output summary:  Intake/Output Summary (Last 24 hours) at 07/27/17 1312 Last data filed at 07/26/17 2300  Gross per 24 hour  Intake              170 ml  Output  600 ml  Net             -430 ml    LBM: Last BM Date: 07/26/17 Baseline Weight: Weight: 98.2 kg (216 lb 8 oz) Most recent weight: Weight: 86.9 kg (191 lb 9.3 oz)     Palliative Assessment/Data:   Flowsheet Rows     Most Recent Value  Intake Tab  Referral Department  Hospitalist  Unit at Time of Referral  Med/Surg Unit  Palliative Care Primary Diagnosis  Other (Comment) [GI]  Date Notified  07/26/17  Palliative Care Type  New Palliative care  Date of Admission  07/16/17  Date first seen by Palliative Care  07/26/17  # of days Palliative referral response time  0 Day(s)  # of days IP prior to Palliative referral  10  Clinical Assessment  Palliative Performance Scale Score  40%  Pain Max last 24 hours  Not able to report  Pain Min Last 24 hours  Not able to report  Dyspnea Max Last 24 Hours  Not able to report  Dyspnea Min Last 24 hours  Not able to report  Psychosocial & Spiritual Assessment  Palliative Care Outcomes  Patient/Family meeting held?  Yes  Who was at the meeting?  patient only at bedside  Palliative Care Outcomes  Counseled regarding hospice, Provided psychosocial or spiritual support, Clarified goals of care  Patient/Family wishes: Interventions discontinued/not started   Mechanical Ventilation      Time In: 0950 Time Out: 1030 Time Total: 40 minutes Greater than 50%  of this time was spent counseling and coordinating care related to the above assessment and plan.  Signed by: Katheran Aweasha  A Jadalee Westcott, NP   Please contact Palliative Medicine Team phone at [phone] for questions and concerns.  For individual provider: See Loretha StaplerAmion

## 2017-07-27 NOTE — Progress Notes (Signed)
PROGRESS NOTE  Shawn [name] SVX:[id number] DOB: [birth year] DOA: 07/16/2017 PCP: Patient, No Pcp Per  Brief Narrative: 56 year old man PMH alcohol abuse, presented with generalized weakness, abdominal distention. Admitted for massive ascites secondary to alcoholic cirrhosis. He was seen by gastroenterology in consultation. He was subsequently diagnosed with spontaneous bacterial peritonitis and treated with a full course of antibiotics and is now on suppressive therapy. Hospitalization has been complicated and prolonged by acute kidney injury which is slowly progressive and of unclear etiology. Further complicating matters is rapidly recurring ascites requiring multiple paracenteses.  Assessment/Plan Decompensated alcoholic cirrhosis with associated thrombocytopenia and massive volume overload. Reportedly stopped drinking alcohol four months prior. Status post large volume paracentesis 8/27, 8/29, 8/31, 9/4. -At this point main issue is rapidly recurring ascites. He will need scheduled outpatient paracenteses, probably 2 per week. -Prognosis remains guarded. Outpatient follow-up with gastroenterology as planned. MELD 29. Patient not eating, always feels bloated, will probably need another paracentesis soon Patient does not have any uremic signs and symptoms  Constipation Will start patient on oral lactulose as the patient's ammonia level is mildly elevated  Acute kidney injury. No previous records available. -Creatinine trending upwards. Management per nephrology includes oral medications. Oncology did not feel plasma cell dyscrasia was likely. Skeletal survey was unremarkable. transfuse albumin Hold aldactone given elevated K  SBP. Cultures no growth, final. -Received a full course of antibiotics. Now on suppressive therapy. 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 -Follow up with oncology as an outpatient. Could consider bone marrow biopsy  Diabetes mellitus type 2. -Remains stable. Continue sliding scale insulin.  Alcohol abuse in remission  Left pleural effusion. Sob , cxr today shows small B/L pleural effusions   Severe malnutrition -B12 and folate levels adequate       DVT prophylaxis: SCDs Code Status: DNR Family Communication: none Disposition Plan: SNF?     ,    Consultants:  Gastroenterology  Nephrology  Procedures:  Large volume paracentesis 8/27  Large volume paracentesis 8/29  Large volume paracentesis 8/31  Large-volume paracentesis 9/4  Antimicrobials:  Ceftriaxone 8/26 >> 9/3  Bactrim 9/3 suppressive therapy  Interval history/Subjective: Complains of feeling bloated, not eating at all  Objective: Vitals: Today's Vitals   07/26/17 1502 07/26/17 2122 07/27/17 0604 07/27/17 1300  BP: 100/67 105/61 (!) 99/52 93/61  Pulse: 82 87 92 99  Resp: 17 20 16 16   Temp: 97.6 F (36.4 C) (!) 97.5 F (36.4 C) 97.9 F (36.6 C) 97.7 F (36.5 C)  TempSrc: Oral Oral Oral Oral  SpO2: 100% 100% 100% 100%  Weight:      Height:      PainSc:         Exam:     Constitutional. Appears chronically ill, nontoxic.  Cardiovascular. Regular rate and rhythm. No murmur, rub or gallop. No change in 3+ bilateral lower extremity edema.  Respiratory. Clear to auscultation bilaterally. No wheezes, rales or rhonchi. Normal respiratory effort.  Abdomen. Soft, distended more so than yesterday. Nontender.       Scheduled Meds: . ciprofloxacin  500 mg Oral Q24H  . feeding supplement (ENSURE ENLIVE)  237 mL Oral TID BM  . insulin aspart  0-9 Units Subcutaneous TID WC  . lactulose  10 g Oral TID  . polyethylene glycol  17 g Oral BID  . torsemide  100 mg Oral Daily  . traZODone  50 mg Oral QHS   Continuous Infusions:   Principal Problem:   Cirrhosis of liver with ascites (HCC) Active Problems:    Ascites due to alcoholic cirrhosis (HCC)   Thrombocytopenia (HCC)   Pleural effusion, left   SBP (spontaneous bacterial peritonitis) (HCC)   Hypoalbuminemia   Severe malnutrition (HCC)   AKI (acute kidney injury) ([place])   Protein-calorie malnutrition, severe   History of alcohol abuse   Polyclonal gammopathy   Palliative care by specialist   Goals of care, counseling/discussion   Encounter for hospice care discussion   Ascites of liver   LOS: 11 days

## 2017-07-27 NOTE — Progress Notes (Signed)
Subjective: Interval History: Patient states that he was able to eat a little bit better today. He doesn't have any nausea or vomiting. His breathing is okay  Objective: Vital signs in last 24 hours: Temp:  [97.5 F (36.4 C)-97.9 F (36.6 C)] 97.9 F (36.6 C) (09/06 0604) Pulse Rate:  [82-92] 92 (09/06 0604) Resp:  [16-20] 16 (09/06 0604) BP: (99-105)/(52-67) 99/52 (09/06 0604) SpO2:  [99 %-100 %] 100 % (09/06 0604) Weight change:   Intake/Output from previous day: 09/05 0701 - 09/06 0700 In: 170 [P.O.:120; IV Piggyback:50] Out: 600 [Urine:600] Intake/Output this shift: No intake/output data recorded.  General appearance: alert, cooperative and no distress Resp: diminished breath sounds bilaterally Cardio: regular rate and rhythm GI: Distended, nontender Extremities: edema 2+ edema bilaterally  Lab Results:  Recent Labs  07/25/17 0610  WBC 8.9  HGB 9.0*  HCT 25.4*  PLT 70*   BMET:   Recent Labs  07/26/17 0905 07/27/17 0502  NA 133* 135  K 5.2* 5.4*  CL 102 105  CO2 19* 21*  GLUCOSE 137* 90  BUN 79* 87*  CREATININE 3.78* 3.81*  CALCIUM 9.1 8.8*   No results for input(s): PTH in the last 72 hours. Iron Studies:  No results for input(s): IRON, TIBC, TRANSFERRIN, FERRITIN in the last 72 hours.  Studies/Results: Dg Chest 2 View  Result Date: 07/26/2017 CLINICAL DATA:  Runell Gess, paracentesis a day ago also 2 days ago EXAM: CHEST - 2 VIEW COMPARISON:  07/16/2017 FINDINGS: Improved aeration in the lung bases. Small residual pleural effusions. Heart size and mediastinal contours are within normal limits. No pneumothorax. Visualized bones unremarkable. IMPRESSION: Improved aeration.  Residual small pleural effusions. Electronically Signed   By: Corlis Leak M.D.   On: 07/26/2017 11:51   US Paracentesis  Result Date: 07/25/2017 INDICATION: Alcoholic cirrhosis, ascites EXAM: ULTRASOUND GUIDED THERAPEUTIC PARACENTESIS MEDICATIONS: None. COMPLICATIONS: None  immediate. PROCEDURE: Procedure, benefits, and risks of procedure were discussed with patient. Written informed consent for procedure was obtained. Time out protocol followed. Adequate collection of ascites localized by ultrasound in LEFT lower quadrant. Skin prepped and draped in usual sterile fashion. Skin and soft tissues anesthetized with 10 mL of 1% lidocaine. 5 [place] Yueh catheter placed into peritoneal cavity. 6.1 L of yellow ascitic fluid aspirated by vacuum bottle suction. Patient reported onset of mild upper abdominal discomfort, the same feeling at associated with the previous paracentesis when he developed postprocedural hypotension ; at the patient's request, the procedure was terminated at this point. Procedure tolerated well by patient without immediate complication. FINDINGS: As above IMPRESSION: Successful ultrasound-guided paracentesis yielding 6.1 liters of peritoneal fluid. Electronically Signed   By: Ulyses Southward M.D.   On: 07/25/2017 15:58    I have reviewed the patient's current medications.  Assessment/Plan: Problem #1 renal failure: Most likely chronic . Etiology not clear but hypertension/ ATN/paraprothenmia . His creatinine continued to increase. Patient does not have any uremic signs and symptoms. Problem #2 history of liver cirrhosis: Patient with significant anasarca. Patient presently on albumin and Demadex. His urine output is no completely documented but he had about 600 mL. Problem #3 anemia: His hemoglobin is below target goal but stable. Problem #4 hypertension: His blood pressure is reasonably controlled Problem #5 history of diabetes: His blood sugar is reasonably controlled Problem #6 bone and mineral disorder: His calcium and phosphorus and remains in range. 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 Problem #8 Patient's Serum IFE shows Polyclonal gammopathy and urine IFE reveals  elevated Free light chain excretion rate and elevated  Kappa to Lambda ratio. Problem #9 hyperkalemia: Possibly secondary to Bactrim. This discontinued. His potassium is 5.4. Plan: 1] will continue his present management 2]Renal panel in am 3] patient doesn't require dialysis at this moment. But may not be very far repeat renal function continued to decline.   LOS: 11 days   Montell Leopard S 07/27/2017,10:05 AM

## 2017-07-27 NOTE — Care Management Note (Signed)
Case Management Note  Patient Details  Name: Shawn JourdainDavid L [name] MRN: [id number] Date of Birth: 10/11/1961  If discussed at Long Length of Stay Meetings, dates discussed:  07/27/2017  Additional Comments: Insurance has no SNF coverage. Pt will have to DC home. [facility] to determine [facility]. FC has seen pt, CM will reach out to her again to see if there is any assistance available for applying for disability.   Malcolm Metrohildress, Adelia Baptista Demske, RN 07/27/2017, 1:42 PM

## 2017-07-28 DIAGNOSIS — R0602 Shortness of breath: Secondary | ICD-10-CM

## 2017-07-28 LAB — RENAL FUNCTION PANEL
ALBUMIN: 2.9 g/dL — AB (ref 3.5–5.0)
Anion gap: 11 (ref 5–15)
BUN: 89 mg/dL — AB (ref 6–20)
CO2: 21 mmol/L — AB (ref 22–32)
Calcium: 9.3 mg/dL (ref 8.9–10.3)
Chloride: 103 mmol/L (ref 101–111)
Creatinine, Ser: 4.14 mg/dL — ABNORMAL HIGH (ref 0.61–1.24)
GFR calc Af Amer: 17 mL/min — ABNORMAL LOW (ref >60)
GFR calc non Af Amer: 15 mL/min — ABNORMAL LOW (ref >60)
GLUCOSE: 109 mg/dL — AB (ref 65–99)
PHOSPHORUS: 5.8 mg/dL — AB (ref 2.5–4.6)
POTASSIUM: 5.5 mmol/L — AB (ref 3.5–5.1)
SODIUM: 135 mmol/L (ref 135–145)

## 2017-07-28 LAB — GLUCOSE, CAPILLARY
GLUCOSE-CAPILLARY: 116 mg/dL — AB (ref 65–99)
Glucose-Capillary: 92 mg/dL (ref 65–99)
Glucose-Capillary: 91 mg/dL (ref 65–99)
Glucose-Capillary: 114 mg/dL — ABNORMAL HIGH (ref 65–99)

## 2017-07-28 LAB — COMPREHENSIVE METABOLIC PANEL
ALT: 10 U/L — AB (ref 17–63)
AST: 22 U/L (ref 15–41)
Albumin: 2.9 g/dL — ABNORMAL LOW (ref 3.5–5.0)
Alkaline Phosphatase: 37 U/L — ABNORMAL LOW (ref 38–126)
Anion gap: 10 (ref 5–15)
BUN: 91 mg/dL — AB (ref 6–20)
CALCIUM: 9.1 mg/dL (ref 8.9–10.3)
CO2: 21 mmol/L — AB (ref 22–32)
CREATININE: 4.16 mg/dL — AB (ref 0.61–1.24)
Chloride: 103 mmol/L (ref 101–111)
GFR, EST AFRICAN AMERICAN: 17 mL/min — AB (ref >60)
GFR, EST NON AFRICAN AMERICAN: 15 mL/min — AB (ref >60)
Glucose, Bld: 110 mg/dL — ABNORMAL HIGH (ref 65–99)
Potassium: 5.5 mmol/L — ABNORMAL HIGH (ref 3.5–5.1)
Sodium: 134 mmol/L — ABNORMAL LOW (ref 135–145)
Total Bilirubin: 1.8 mg/dL — ABNORMAL HIGH (ref 0.3–1.2)
Total Protein: 6 g/dL — ABNORMAL LOW (ref 6.5–8.1)

## 2017-07-28 MED ORDER — ALBUMIN HUMAN 25 % IV SOLN: 12.5000 g | Freq: Once | INTRAVENOUS | Status: DC

## 2017-07-28 MED ORDER — ALBUMIN HUMAN 25 % IV SOLN
25.0000 g | Freq: Once | INTRAVENOUS | Status: AC
  Administered 2017-07-28: 25 g via INTRAVENOUS
  Filled 2017-07-28: qty 100

## 2017-07-28 NOTE — Progress Notes (Addendum)
PROGRESS NOTE  Shawn [name] CHE:[id number] DOB: Aug 29, 1961 DOA: 07/16/2017 PCP: Patient, No Pcp Per  Brief Narrative: 56 year old man PMH alcohol abuse, presented with generalized weakness, abdominal distention. Admitted for massive ascites secondary to alcoholic cirrhosis. He was seen by gastroenterology in consultation. He was subsequently diagnosed with spontaneous bacterial peritonitis and treated with a full course of antibiotics and is now on suppressive therapy. Hospitalization has been complicated and prolonged by acute kidney injury which is slowly progressive and of unclear etiology. Further complicating matters is rapidly recurring ascites requiring multiple paracenteses.  Assessment/Plan Decompensated alcoholic cirrhosis with associated thrombocytopenia and massive volume overload. Reportedly stopped drinking alcohol four months prior. Status post large volume paracentesis 8/27, 8/29, 8/31, 9/4. -At this point main issue is rapidly recurring ascites. He will need scheduled outpatient paracenteses, probably twice a    Week. Palliative recommending a pleurex catheter .Requesting surgery to place this  Prognosis remains guarded. Outpatient follow-up with gastroenterology as planned. MELD 29. Patient not eating, always feels bloated, will probably need another paracentesis soon Patient does not have any uremic signs and symptoms   Constipation  on oral lactulose as the patient's ammonia level is mildly elevated  Acute kidney injury worsening  Creatinine trending upwards. Management per nephrology includes oral medications.  Oncology did not feel plasma cell dyscrasia was likely. Skeletal survey was unremarkable. transfuse albumin Held  aldactone given elevated K and worsening creatinine  Patient is not a candidate for HD   SBP. Cultures no growth, final. -Received a full course of antibiotics. Now on suppressive therapy. 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 -Follow up with oncology as an outpatient. Could consider bone marrow biopsy  Diabetes mellitus type 2. -Remains stable. Continue sliding scale insulin.  Alcohol abuse in remission  Left pleural effusion. Sob , cxr today shows small B/L pleural effusions   Severe malnutrition -B12 and folate levels adequate       DVT prophylaxis: SCDs Code Status: DNR Family Communication: daughter by bedside  , had bedside meeting today , to try to make disposition decisions   Disposition Plan: may need home with hospice vs [facility] [place] [facility]      ,    Consultants:  Gastroenterology  Nephrology  Procedures:  Large volume paracentesis 8/27  Large volume paracentesis 8/29  Large volume paracentesis 8/31  Large-volume paracentesis 9/4  Antimicrobials:  Ceftriaxone 8/26 >> 9/3  Bactrim 9/3 suppressive therapy    Interval history/Subjective:  not eating at all, denies nausea, feels bloated , daughter by bedside ,   Objective: Vitals: Today's Vitals   07/27/17 1300 07/27/17 2029 07/27/17 2043 07/28/17 0517  BP: 93/61  103/61 99/62  Pulse: 99  83 85  Resp: _0 Temp: 97.7 F (36.5 C)  97.7 F (36.5 C) 97.9 F (36.6 C)  TempSrc: Oral  Oral Oral  SpO2: 100% 99% 99% 99%  Weight:      Height:      PainSc:         Exam:     Constitutional. Appears chronically ill, nontoxic.  Cardiovascular. Regular rate and rhythm. No murmur, rub or gallop. No change in 3+ bilateral lower extremity edema.  Respiratory. Clear to auscultation bilaterally. No wheezes, rales or rhonchi. Normal respiratory effort.  Abdomen. Soft, distended more so than yesterday. Nontender.       Scheduled Meds: .  ciprofloxacin  500 mg Oral Q24H  . feeding supplement (ENSURE ENLIVE)  237 mL Oral TID BM  . insulin aspart  0-9 Units Subcutaneous TID WC  . lactulose   10 g Oral TID  . polyethylene glycol  17 g Oral BID  . torsemide  100 mg Oral Daily  . traZODone  50 mg Oral QHS   Continuous Infusions:   Principal Problem:   Cirrhosis of liver with ascites (HCC) Active Problems:   Ascites due to alcoholic cirrhosis (HCC)   Thrombocytopenia (HCC)   Pleural effusion, left   SBP (spontaneous bacterial peritonitis) (HCC)   Hypoalbuminemia   Severe malnutrition (HCC)   AKI (acute kidney injury) ([place])   Protein-calorie malnutrition, severe   History of alcohol abuse   Polyclonal gammopathy   Palliative care by specialist   Goals of care, counseling/discussion   Encounter for hospice care discussion   Ascites of liver   LOS: 12 days

## 2017-07-28 NOTE — Progress Notes (Signed)
Pt refused dose of lactulose, states " I know that medicine is making me worse." Educated pt on use of lactulose and importance of following medication regimen, will continue to monitor.

## 2017-07-28 NOTE — Progress Notes (Signed)
Shawn [name]  MRN: [id number]  DOB/AGE: October 03, 1961 56 y.o.  Primary Care Physician:Patient, No Pcp Per  Admit date: 07/16/2017  Chief Complaint:  Chief Complaint  Patient presents with  . abdominal distension  . Weakness    S-Pt presented on  07/16/2017 with  Chief Complaint  Patient presents with  . abdominal distension  . Weakness  .    Pt offers no new complaints.     Pt daughter was present in the room    Pt and daughter expressed their desire to talk to Hospice .    Meds . ciprofloxacin  500 mg Oral Q24H  . feeding supplement (ENSURE ENLIVE)  237 mL Oral TID BM  . insulin aspart  0-9 Units Subcutaneous TID WC  . lactulose  10 g Oral TID  . polyethylene glycol  17 g Oral BID  . torsemide  100 mg Oral Daily  . traZODone  50 mg Oral QHS       Physical Exam: Vital signs in last 24 hours: Temp:  [97.7 F (36.5 C)-97.9 F (36.6 C)] 97.7 F (36.5 C) (09/07 1258) Pulse Rate:  [83-85] 84 (09/07 1258) Resp:  [16-20] 20 (09/07 1258) BP: (91-103)/(52-62) 91/52 (09/07 1258) SpO2:  [99 %-100 %] 100 % (09/07 1258) Weight change:  Last BM Date: 07/27/17  Intake/Output from previous day: 09/06 0701 - 09/07 0700 In: 360 [P.O.:360] Out: 700 [Urine:700] Total I/O In: 240 [P.O.:240] Out: -    Physical Exam: General- pt is awake,alert, oriented to time place and person Resp- No acute REsp distress, decreased at bases CVS- S1S2 regular in rate and rhythm GIT- BS+, soft, NT, ND EXT- 1+ LE Edema, No Cyanosis   Lab Results: HGb  9.0 on 07/25/17   BMET  Recent Labs  07/27/17 0502 07/28/17 0406  NA 135 135  134*  K 5.4* 5.5*  5.5*  CL 105 103  103  CO2 21* 21*  21*  GLUCOSE 90 109*  110*  BUN 87* 89*  91*  CREATININE 3.81* 4.14*  4.16*  CALCIUM 8.8* 9.3  9.1    Creat trend 2018  2.4=>2.8=>3.09=>3.4=>3.8=>4.1  MICRO Recent Results (from the past 240 hour(s))  Gram stain     Status: None   Collection Time: 07/19/17  4:42 PM  Result Value Ref  Range Status   Specimen Description PERITONEAL CAVITY  Final   Special Requests NONE  Final   Gram Stain   Final    Performed at [facility] WBC PRESENT,BOTH PMN AND MONONUCLEAR    Report Status 07/19/2017 FINAL  Final  Culture, body fluid-bottle     Status: None   Collection Time: 07/19/17  4:42 PM  Result Value Ref Range Status   Specimen Description ASCITIC COLLECTED BY DOCTOR  Final   Special Requests BOTTLES DRAWN AEROBIC AND ANAEROBIC 10 CC EACH  Final   Culture NO GROWTH 5 DAYS  Final   Report Status 07/24/2017 FINAL  Final      Lab Results  Component Value Date   CALCIUM 9.1 07/28/2017   CALCIUM 9.3 07/28/2017   PHOS 5.8 (H) 07/28/2017       Auto immune work up Hepb/ hepc/HIV-negative C-ANCA negative P-ANCA negative ANA negative ASO low Anti GBM negative Complements low   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  AKI on CKD ?               CKD stage not sure. not sure as not much data avaiable               CKD since not sure as not much data available               Pt did no have any labs for past 20+ years                ANCA vasculitis?                  AKI worsening                Creat trending up.                           Renal biopsy risks                INR high                PT high                Inability of pt to lie flat as abdomen distended                                 2)HTN  Medication- Was On RAS blockers. On Diuretics-Lasix.  3)Anemia HGb low Primary team following  4)Liver-admitted with liver cirrhosis Sec to ETOH abuse   5)Resp-admitted with pleural effusion PMD following  6)Electrolytes  Hyperkalemic  Hyponatremic Hypervolemia Hyponatremia sec to cirrhosis Now better  7)Acid base Co2 at goal  8) Hyperphosphatemia    Will follow    Plan:  Agree with holding  bactrim Pt on diuretics-which should help with hyperkalemia Pt creat is still trending high. I did educate pt about worsening of kidney disease. Pt and daughter voiced understanding.They were waiting to talk to hospice.  I tried to answer their questions to best of my ability.        Arial S 07/28/2017, 4:44 PM

## 2017-07-28 NOTE — Progress Notes (Signed)
Physical Therapy Treatment Patient Details Name: Shawn JourdainDavid L [name] MRN: [id number] DOB: 11/21/1960 Today's Date: 07/28/2017    History of Present Illness Shawn LaymanDavid [name]  is a 56 y.o. male, With history of alcohol abuse, hypertension who came to hospital with complaints of generalized weakness and abdominal distention for past 3 months. Patient says that he quit drinking 4 months ago but he has been drinking almost all his life. Patient says he does not visit doctors and does not take any medications. He does have diabetes but has been taking care of diabetes by diet alone. He denies nausea vomiting or diarrhea. No fever. No chills. He complains of shortness of breath. No chest pain.    PT Comments    Patient states he feels better and agreeable for functional activity today (see below), limited secondary to c/o fatigue and mild diarrhea.  Patient will benefit from continued physical therapy in hospital and recommended venue below to increase strength, balance, endurance for safe ADLs and gait.   Follow Up Recommendations  Supervision/Assistance - 24 hour;SNF     Equipment Recommendations  Rolling walker with 5" wheels    Recommendations for Other Services       Precautions / Restrictions Precautions Precautions: Fall Restrictions Weight Bearing Restrictions: No    Mobility  Bed Mobility Overal bed mobility: Needs Assistance Bed Mobility: Supine to Sit;Sit to Supine     Supine to sit: Min assist Sit to supine: Min assist   General bed mobility comments: Has difficulty moving legs  Transfers Overall transfer level: Needs assistance Equipment used: Rolling walker (2 wheeled) Transfers: Sit to/from [employer]Stand;Stand Pivot Transfers Sit to Stand: Min guard;Min assist Stand pivot transfers: Min guard;Min assist          Ambulation/Gait Ambulation/Gait assistance: Min guard Ambulation Distance (Feet): 45 Feet Assistive device: Rolling walker (2 wheeled) Gait Pattern/deviations:  Decreased step length - right;Decreased step length - left;Decreased stride length   Gait velocity interpretation: Below normal speed for age/gender General Gait Details: Demonstrates increased tolerance for gait without loss of balance, had episode of mild diarrhea    Stairs            Wheelchair Mobility    Modified Rankin (Stroke Patients Only)       Balance Overall balance assessment: Needs assistance Sitting-balance support: Feet supported Sitting balance-Leahy Scale: Good     Standing balance support: Bilateral upper extremity supported;During functional activity Standing balance-Leahy Scale: Fair                              Cognition Arousal/Alertness: Awake/alert Behavior During Therapy: WFL for tasks assessed/performed Overall Cognitive Status: Within Functional Limits for tasks assessed                                        Exercises General Exercises - Lower Extremity Ankle Circles/Pumps: AROM;Both;Seated;10 reps Long Arc Quad: Seated;AROM;Both;10 reps Hip Flexion/Marching: Seated;AROM;Both;10 reps    General Comments        Pertinent Vitals/Pain Pain Assessment: No/denies pain    Home Living                      Prior Function            PT Goals (current goals can now be found in the care plan section) Acute Rehab PT Goals Patient Stated  Goal: Return home with assistance PT Goal Formulation: With patient Time For Goal Achievement: 08/01/17 Potential to Achieve Goals: Good Progress towards PT goals: Progressing toward goals    Frequency    Min 3X/week      PT Plan Current plan remains appropriate    Co-evaluation              AM-PAC PT "6 Clicks" Daily Activity  Outcome Measure  Difficulty turning over in bed (including adjusting bedclothes, sheets and blankets)?: Unable Difficulty moving from lying on back to sitting on the side of the bed? : Unable Difficulty sitting down on and  standing up from a chair with arms (e.g., wheelchair, bedside commode, etc,.)?: Unable Help needed moving to and from a bed to chair (including a wheelchair)?: A Little Help needed walking in hospital room?: A Little Help needed climbing 3-5 steps with a railing? : A Lot 6 Click Score: 11    End of Session Equipment Utilized During Treatment: Gait belt Activity Tolerance: Patient tolerated treatment well;Patient limited by fatigue Patient left:  (left sitting on BSC with call light in reach - nursing staff aware) Nurse Communication: Mobility status PT Visit Diagnosis: Unsteadiness on feet (R26.81);Other abnormalities of gait and mobility (R26.89);Muscle weakness (generalized) (M62.81)     Time: 8413-2440 PT Time Calculation (min) (ACUTE ONLY): 29 min  Charges:  $Therapeutic Activity: 23-37 mins                    G Codes:       2:43 PM, 08/03/17 Ocie Bob, MPT Physical Therapist with [name] [phone] office [phone] mobile phone

## 2017-07-28 NOTE — Progress Notes (Signed)
Daily Progress Note   Patient Name: Shawn [name]       Date: 07/28/2017 DOB: Sep 06, 1961  Age: 56 y.o. MRN#: [id number] Attending Physician: Richarda Overlie, MD Primary Care Physician: Patient, No Pcp Per Admit Date: 07/16/2017  Reason for Consultation/Follow-up: Establishing goals of care, Hospice Evaluation and Psychosocial/spiritual support  Subjective: Shawn [name] is resting quietly in bed. He greets me making and keeping eye contact as I enter. Present today is his daughter, Shawn [name] from [place]. Her husband and young son are also present. We talk about the current treatment plan for few minutes, until case management arrives. Case management offers information regarding services at home in detail.  Conference with hospitalist, Dr. Susie Cassette,  related to Shawn [name]'s severe health declines. We returned to his room to discuss he is treatment plan and options. We discuss the realities of his medical situation. We review labs that are not improving.  He asks about prognosis. I share that when people are nearing end-of-life, they sleep more, interact less, and eat less. I share that this is what he is experiencing already. I share that it would not be surprising if he had 6 to 12 weeks. Dr. Susie Cassette agrees, she leaves me to continue palliative care discussions. We talk about what's next, and what possibilities are offered. We discuss PleurX permanent drain, we discuss in-home hospice care. We discuss healthcare power of attorney. We discuss durable power of attorney. They have financial questions, but I encourage them to seek the advice of a lawyer.  Shawn [name] and his daughter state they would like PleurX drain placement, and conference with [facility] representative.  Call to Erick Colace with [facility] , case discussed. He states he can meet with Shawn [name] today at 4 PM.  Conference with nursing staff.  Length of Stay: 12  Current Medications: Scheduled Meds:  . ciprofloxacin  500 mg Oral Q24H  . feeding supplement (ENSURE ENLIVE)  237 mL Oral TID BM  . insulin aspart  0-9 Units Subcutaneous TID WC  . lactulose  10 g Oral TID  . polyethylene glycol  17 g Oral BID  . torsemide  100 mg Oral Daily  . traZODone  50 mg Oral QHS    Continuous Infusions: . albumin human      PRN Meds: bisacodyl, ondansetron **  OR** ondansetron (ZOFRAN) IV  Physical Exam  Constitutional: No distress.  HENT:  Temporal wasting   Cardiovascular: Normal rate.   Pulmonary/Chest: Effort normal. No respiratory distress.  Musculoskeletal: He exhibits edema.  Neurological: He is alert.  Skin: Skin is warm and dry.  Nursing note and vitals reviewed.           Vital Signs: BP 99/62 (BP Location: Right Arm)   Pulse 85   Temp 97.9 F (36.6 C) (Oral)   Resp 16   Ht 5\' 9"  (1.753 m)   Wt 86.9 kg (191 lb 9.3 oz)   SpO2 99%   BMI 28.29 kg/m  SpO2: SpO2: 99 % O2 Device: O2 Device: Not Delivered O2 Flow Rate: O2 Flow Rate (L/min): 3 L/min  Intake/output summary:  Intake/Output Summary (Last 24 hours) at 07/28/17 1050 Last data filed at 07/27/17 1755  Gross per 24 hour  Intake              240 ml  Output              300 ml  Net              -60 ml   LBM: Last BM Date: 07/26/17 Baseline Weight: Weight: 98.2 kg (216 lb 8 oz) Most recent weight: Weight: 86.9 kg (191 lb 9.3 oz)       Palliative Assessment/Data:    Flowsheet Rows     Most Recent Value  Intake Tab  Referral Department  Hospitalist  Unit at Time of Referral  Med/Surg Unit  Palliative Care Primary Diagnosis  Other (Comment) [GI]  Date Notified  07/26/17  Palliative Care Type  New Palliative care  Date of Admission  07/16/17  Date first seen by Palliative Care  07/26/17  # of days Palliative referral  response time  0 Day(s)  # of days IP prior to Palliative referral  10  Clinical Assessment  Palliative Performance Scale Score  40%  Pain Max last 24 hours  Not able to report  Pain Min Last 24 hours  Not able to report  Dyspnea Max Last 24 Hours  Not able to report  Dyspnea Min Last 24 hours  Not able to report  Psychosocial & Spiritual Assessment  Palliative Care Outcomes  Patient/Family meeting held?  Yes  Who was at the meeting?  patient only at bedside  Palliative Care Outcomes  Counseled regarding hospice, Provided psychosocial or spiritual support, Clarified goals of care  Patient/Family wishes: Interventions discontinued/not started   Mechanical Ventilation      Patient Active Problem List   Diagnosis Date Noted  . Palliative care by specialist   . Goals of care, counseling/discussion   . Encounter for hospice care discussion   . Ascites of liver   . 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 History of alcohol abuse   . Protein-calorie malnutrition, severe 07/18/2017  . Cirrhosis of liver with ascites (HCC)   . Thrombocytopenia (HCC) 07/17/2017  . Pleural effusion, left 07/17/2017  . SBP (spontaneous bacterial peritonitis) (HCC) 07/17/2017  . Hypoalbuminemia 07/17/2017  . Severe malnutrition (HCC) 07/17/2017  . AKI (acute kidney injury) (HCC) 07/17/2017  . Ascites due to alcoholic cirrhosis (HCC) 07/16/2017    Palliative Care Assessment & Plan   Patient Profile: 56 y.o. male  with past medical history of Diabetes, and gout, hypertension, alcohol abuse, last drink 5 months ago admitted on 07/16/2017 with decompensated alcoholic cirrhosis with acute kidney injury, SBP.   Assessment: decompensated alcoholic cirrhosis  with acute kidney injury, SBP: 30 L of fluid removed and 4 paracentesis since admission. Continues to have fluid buildup. Shawn [name] states that during the last para, he experienced dizziness and was unable to continue past 6 L. We talk about the benefits and  risks of PleurX drain. His daughter is at bedside. They both agree that a PleurX drain would provide palliative relief for Shawn [name], unburdening him from multiple hospital visits each week.  Recommendations/Plan:  Shawn [name] is requesting PleurX drain for palliative relief.   [facility] for in home services.   Goals of Care and Additional Recommendations:  Limitations on Scope of Treatment: PleurX drain palliative, [facility] referal, continue with treatments at this time, but NO CPR, no intubation   Code Status:    Code Status Orders        Start     Ordered   07/17/17 1827  Do not attempt resuscitation (DNR)  Continuous    Question Answer Comment  In the event of cardiac or respiratory ARREST Do not call a "code blue"   In the event of cardiac or respiratory ARREST Do not perform Intubation, CPR, defibrillation or ACLS   In the event of cardiac or respiratory ARREST Use medication by any route, position, wound care, and other measures to relive pain and suffering. May use oxygen, suction and manual treatment of airway obstruction as needed for comfort.      07/17/17 1830    Code Status History    Date Active Date Inactive Code Status Order ID Comments User Context   07/16/2017  9:16 PM 07/17/2017  6:30 PM Full Code [id number]  Meredeth Ide, MD Inpatient       Prognosis:   < 3 months, 6 to 12 weeks or less would not be surprising based on admission albumin of 1.8, creatinine trend from 2.39 on admission to 4.14 today, BUN increased from 30 on admission to 89 today, frailty and functional decline  Discharge Planning:  Evaluation for assistance from [facility] hospice for in-home services.  Care plan was discussed with nursing staff, case manager, social worker, and Dr. Susie Cassette.   Thank you for allowing the Palliative Medicine Team to assist in the care of this patient.   Time In: 1130 Time Out: 1330 Total Time 120 minutes Prolonged Time Billed  yes         Greater than 50%  of this time was spent counseling and coordinating care related to the above assessment and plan.  Katheran Awe, NP  Please contact Palliative Medicine Team phone at [phone] for questions and concerns.

## 2017-07-28 NOTE — Progress Notes (Signed)
Subjective: Feels somewhat better today, swelling improved in his legs. Thinks his stomach is "half full" of fluid. No overt abdominal pain, breathing is better. Denies N/V. Has some loose stools last night. No other GI complaints.  Objective: Vital signs in last 24 hours: Temp:  [97.7 F (36.5 C)-97.9 F (36.6 C)] 97.9 F (36.6 C) (09/07 0517) Pulse Rate:  [83-99] 85 (09/07 0517) Resp:  [16] 16 (09/07 0517) BP: (93-103)/(61-62) 99/62 (09/07 0517) SpO2:  [99 %-100 %] 99 % (09/07 0517) Last BM Date: 07/26/17 General:   Alert and oriented, pleasant. Appears emaciated. Head:  Normocephalic and atraumatic. Eyes:  No icterus, sclera clear. Conjuctiva pink.  Heart:  S1, S2 present, no murmurs noted.  Lungs: Clear to auscultation bilaterally, without wheezing, rales, or rhonchi.  Abdomen:  Bowel sounds present, soft, non-tender, mildly distended. No HSM or hernias noted. No rebound or guarding.  Msk:  Symmetrical without gross deformities. Pulses:  Normal pulses noted. Extremities:  Without clubbing. Bilateral 1-2+ pitting edema up to knees Neurologic:  Alert and  oriented x4;  grossly normal neurologically. Psych:  Alert and cooperative. Normal mood and affect.  Intake/Output from previous day: 09/06 0701 - 09/07 0700 In: 360 [P.O.:360] Out: 700 [Urine:700] Intake/Output this shift: No intake/output data recorded.  Lab Results: No results for input(s): WBC, HGB, HCT, PLT in the last 72 hours. BMET  Recent Labs  07/26/17 0905 07/27/17 0502 07/28/17 0406  NA 133* 135 135  134*  K 5.2* 5.4* 5.5*  5.5*  CL 102 105 103  103  CO2 19* 21* 21*  21*  GLUCOSE 137* 90 109*  110*  BUN 79* 87* 89*  91*  CREATININE 3.78* 3.81* 4.14*  4.16*  CALCIUM 9.1 8.8* 9.3  9.1   LFT  Recent Labs  07/26/17 0905 07/27/17 0502 07/28/17 0406  PROT 6.1* 5.5* 6.0*  ALBUMIN 3.1* 2.8* 2.9*  2.9*  AST 26 19 22   ALT 10* 9* 10*  ALKPHOS 37* 34* 37*  BILITOT 1.9* 1.9* 1.8*   BILIDIR 0.6*  --   --    PT/INR  Recent Labs  07/26/17 0904  LABPROT 23.8*  INR 2.15   Hepatitis Panel No results for input(s): HEPBSAG, HCVAB, HEPAIGM, HEPBIGM in the last 72 hours.   Studies/Results: Dg Chest 2 View  Result Date: 07/27/2017 CLINICAL DATA:  Onset of shortness of breath today. History of hypertension, cirrhosis and ascites, acute renal injury EXAM: CHEST  2 VIEW COMPARISON:  Chest x-ray of July 26, 2017 FINDINGS: The lungs are reasonably well inflated. Small bilateral pleural effusions persist. There is no alveolar infiltrate or pleural effusion. The heart and pulmonary vascularity are normal. The mediastinum is normal in width. The bony thorax exhibits no acute abnormality. IMPRESSION: Persistent small bilateral pleural effusions. Otherwise no active cardiopulmonary disease. Electronically Signed   By: Khamari  Swazil[place] M.D.   On: 07/27/2017 16:01   Dg Chest 2 View  Result Date: 07/26/2017 CLINICAL DATA:  Runell GessWeakness,sob, paracentesis a day ago also 2 days ago EXAM: CHEST - 2 VIEW COMPARISON:  07/16/2017 FINDINGS: Improved aeration in the lung bases. Small residual pleural effusions. Heart size and mediastinal contours are within normal limits. No pneumothorax. Visualized bones unremarkable. IMPRESSION: Improved aeration.  Residual small pleural effusions. Electronically Signed   By: Corlis Leak  Hassell M.D.   On: 07/26/2017 11:51    Assessment: 56 year old male with decompensated cirrhosis, SBP, completed course of IV Rocephin. Now on Bactrim DS daily for SBP prophylaxis due to increased  risk of recurrent SBP. Total of four LVAP this admission resulting in over 30 liters of ascitic fluid removed. LE edema has significantly improved. Receiving IV albumin. Renal function without improvement, with diuretic therapy adjusted per nephrology.   Labs show stable AST/ALT, stable bilirubin (1.8), low albumin at 2.9. Cratinine worsened t 4.16 today. MELD from 07/26/17: 31 (52.6% 70-month  mortality)  Poor prognosis overall.  Plan: 1. Continue SBP prophylaxis 2. Paracentesis as needed (theraputic) 3. Continue lactulose 4. Supportive measures   Thank you for allowing [place] to participate in the care of Shawn Dace, DNP, AGNP-C Adult & Gerontological Nurse Practitioner [facility]     LOS: 12 days    07/28/2017, 8:19 AM

## 2017-07-28 NOTE — Care Management (Signed)
CM spoke with daughter at bedside regarding referrals and after hospital needs.

## 2017-07-28 NOTE — Care Management Note (Signed)
Case Management Note  Patient Details  Name: Shawn JourdainDavid L Cowdrey MRN: [id number] Date of Birth: 09/02/1961  Expected Discharge Date:  07/17/17               Expected Discharge Plan:  Home/Self Care  In-House Referral:  NA  Discharge planning Services  CM Consult  Post Acute Care Choice:  NA Choice offered to:  NA  DME Arranged:  Walker rolling, Hospital bed DME Agency:  [facility]  Status of Service:  Completed, signed off   Additional Comments: DC anticipated over weekend. Pt need intensive support after DC but has limited access to recourses. CM has made multiple referrals to "flood" patient with potential resources.   [facility] - they will see pt and will contact CM on Monday to get DC summary and get list of pt's needs and DC date.   Pt referred to [facility] - may not qualify due to having the insurance plan, CM has left CM with [facility] intake to determine illegibility.   [facility] because he will not qualify for [facility] and pt unable to afford co-pays. Pt has refused RW and Hospital bed because [facility] due to having the insurance plan.   CM has again emphasized importance of applying for disability ASAP.   Pt asks about hospice - he is unsure if he wants hospice. CM emphasized that was his choice. Decide to proceed with treatments for now and see how the next few weeks-month go. If he gets better and to a point he can live a tolerable life he wants to do that but if not and he cont to struggle he wants to pursue hospice. CM given contact number for [facility] as pt will most likely need facility placement due to lack of support.   Malcolm Metrohildress, Njeri Vicente Demske, RN 07/28/2017, 11:03 AM

## 2017-07-28 NOTE — Clinical Social Work Note (Signed)
Patient's insurance company does not offer SNF benefits. Patient's daughter is scheduling appointment for disability for Monday. Patient is aware of this.  LCSW signing off.      Shawn [name], Juleen ChinaHeather D, LCSW

## 2017-07-29 DIAGNOSIS — J90 Pleural effusion, not elsewhere classified: Secondary | ICD-10-CM

## 2017-07-29 DIAGNOSIS — K7031 Alcoholic cirrhosis of liver with ascites: Secondary | ICD-10-CM

## 2017-07-29 DIAGNOSIS — K652 Spontaneous bacterial peritonitis: Secondary | ICD-10-CM

## 2017-07-29 LAB — COMPREHENSIVE METABOLIC PANEL
ALT: 10 U/L — ABNORMAL LOW (ref 17–63)
ANION GAP: 9 (ref 5–15)
AST: 22 U/L (ref 15–41)
Albumin: 2.9 g/dL — ABNORMAL LOW (ref 3.5–5.0)
Alkaline Phosphatase: 40 U/L (ref 38–126)
BILIRUBIN TOTAL: 1.7 mg/dL — AB (ref 0.3–1.2)
BUN: 93 mg/dL — ABNORMAL HIGH (ref 6–20)
CALCIUM: 9 mg/dL (ref 8.9–10.3)
CO2: 21 mmol/L — ABNORMAL LOW (ref 22–32)
Chloride: 105 mmol/L (ref 101–111)
Creatinine, Ser: 4.07 mg/dL — ABNORMAL HIGH (ref 0.61–1.24)
GFR calc Af Amer: 17 mL/min — ABNORMAL LOW (ref >60)
GFR, EST NON AFRICAN AMERICAN: 15 mL/min — AB (ref >60)
Glucose, Bld: 119 mg/dL — ABNORMAL HIGH (ref 65–99)
POTASSIUM: 4.7 mmol/L (ref 3.5–5.1)
Sodium: 135 mmol/L (ref 135–145)
Total Protein: 5.8 g/dL — ABNORMAL LOW (ref 6.5–8.1)

## 2017-07-29 LAB — GLUCOSE, CAPILLARY
GLUCOSE-CAPILLARY: 132 mg/dL — AB (ref 65–99)
Glucose-Capillary: 111 mg/dL — ABNORMAL HIGH (ref 65–99)
Glucose-Capillary: 121 mg/dL — ABNORMAL HIGH (ref 65–99)
Glucose-Capillary: 170 mg/dL — ABNORMAL HIGH (ref 65–99)

## 2017-07-29 MED ORDER — FLEET ENEMA 7-19 GM/118ML RE ENEM
1.0000 | ENEMA | Freq: Once | RECTAL | Status: AC
Start: 2017-07-29 — End: 2017-07-29
  Administered 2017-07-29: 1 via RECTAL

## 2017-07-29 NOTE — Progress Notes (Signed)
  Subjective:  Patient denies abdominal pain. He feels abdomen is no more distended than yesterday. He is passing liquid stool. He feels he may be impacted. He denies melena or rectal bleeding.   Objective: Blood pressure 100/63, pulse 96, temperature 98.6 F (37 C), temperature source Oral, resp. rate 18, height 5\' 9"  (1.753 m), weight 191 lb 9.3 oz (86.9 kg), SpO2 99 %. Patient has generalized wasting. He does not asterixis Abdomen is full. There is no leakage from prior puncture site. Flanks are bulging. No organomegaly or masses. 1+ pitting edema to both legs noted.  Labs/studies Results:   BMET   Recent Labs  07/27/17 0502 07/28/17 0406 07/29/17 0819  NA 135 135  134* 135  K 5.4* 5.5*  5.5* 4.7  CL 105 103  103 105  CO2 21* 21*  21* 21*  GLUCOSE 90 109*  110* 119*  BUN 87* 89*  91* 93*  CREATININE 3.81* 4.14*  4.16* 4.07*  CALCIUM 8.8* 9.3  9.1 9.0    LFT   Recent Labs  07/27/17 0502 07/28/17 0406 07/29/17 0819  PROT 5.5* 6.0* 5.8*  ALBUMIN 2.8* 2.9*  2.9* 2.9*  AST 19 22 22   ALT 9* 10* 10*  ALKPHOS 34* 37* 40  BILITOT 1.9* 1.8* 1.7*    PT/INR  No results for input(s): LABPROT, INR in the last 72 hours.  Hepatitis Panel  No results for input(s): HEPBSAG, HCVAB, HEPAIGM, HEPBIGM in the last 72 hours.   Assessment:  #1. 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 He also has ascites and SBP which has been treated and now he is on Cipro for secondary prophylaxis. Ascites is mild to moderate. Last LVAP was 4 days ago. He has been referred to hospice. Patient does not meet all the criterion for referral for transplant evaluation.  #2. Kidney disease. He is being evaluated by Dr. Wolfgang PhoenixBhutani. He possibly acute injury.hepatorenal syndrome remains in differential diagnosis. 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  #3.Malnutrition.Patient is cachectic secondary to chronic  illness and diminished oral intake.   Recommendations:  Fleet enema 1. Continue supportive therapy. Repeat abdominal tap as needed. Metabolic 7, INR and bilirubin in a.m.

## 2017-07-29 NOTE — Progress Notes (Signed)
[place] TEAM 2  SANG BLOUNT  ZOX:[id number] DOB: 1960-11-29 DOA: 07/16/2017 PCP: Patient, No Pcp Per    Brief Narrative:  56yo M w/ a hx of alcohol abuse who presented with generalized weakness, abdominal distention. Admitted for massive ascites secondary to alcoholic cirrhosis. He was seen by gastroenterology in consultation. He was subsequently diagnosed with spontaneous bacterial peritonitis and treated with a full course of antibiotics and is now on suppressive therapy. Hospitalization has been complicated and prolonged by acute kidney injury which is slowly progressive and of unclear etiology. Further complicating matters is rapidly recurring ascites requiring multiple paracenteses.  Subjective: Patient continues to complain of a feeling of a fecal impaction.  He is having frequent watery stools.  He denies chest pain or shortness of breath.  He is not yet willing to attempt an enema.  Assessment & Plan:  Decompensated alcoholic cirrhosis with associated thrombocytopenia and massive volume overload - MELD 29 - stopped drinking alcohol four months prior to this admit  - s/p large volume paracentesis 8/27, 8/29, 8/31, 9/4 - rapidly recurring ascites - will need scheduled outpatient paracenteses ~2x/week - Palliative recommending a pleurex catheter w/ Gen Surgery making plans for placement - outpatient follow-up with gastroenterology as planned  Constipation / ?impaction  lactulose continues - will try large volume enema as pt allows if sx persist   Acute kidney injury 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 - not a candidate for HD - appears crt has peaked - follow for improvement   Recent Labs Lab 07/25/17 0605 07/26/17 0905 07/27/17 0502 07/28/17 0406 07/29/17 0819  CREATININE 3.41* 3.78* 3.81* 4.14*  4.16* 4.07*   SBP - Cultures no growth received a full course of antibiotics - now on suppressive therapy w/ cipro   Polyclonal gammopathy -Follow up with Oncology as  an outpatient  DM2 CBG is presently well controlled  Alcohol abuse in remission  Persistent small bilateral pleural effusions Not clinically significant at the present time - 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 rate 20, height  (1.753 m), weight 86.9 kg (191 lb 9.3 oz), SpO2 100 %.  Intake/Output Summary (Last 24 hours) at 07/29/17 1621 Last data filed at 07/29/17 1300  Gross per 24 hour  Intake              360 ml  Output                0 ml  Net              360 ml   Filed Weights   07/16/17 2112 07/20/17 0456 07/26/17 0629  Weight: 98.2 kg (216 lb 8 oz) 86.9 kg (191 lb 9.6 oz) 86.9 kg (191 lb 9.3 oz)    Examination: General: No acute respiratory distress Lungs: Clear to auscultation bilaterally without wheezes or crackles - poor air movement B bases  Cardiovascular: Regular rate and rhythm without murmur  Abdomen: Nontender, distended, soft, bowel sounds positive, no rebound Extremities: 3+ edema B LE  CBC:  Recent Labs Lab 07/25/17 0610  WBC 8.9  HGB 9.0*  HCT 25.4*  MCV 96.9  PLT 70*   Basic Metabolic Panel:  Recent Labs Lab 07/23/17 0415 07/24/17 0609 07/25/17 0605 07/26/17 0905 07/27/17 0502 07/28/17 0406 07/29/17 0819  NA 134* 133* 135 133* 135 135  134* 135  K 4.7 4.8 5.0 5.2* 5.4* 5.5*  5.5* 4.7  CL 105 103 106 102 105 103  103 105  CO2 20* 21* 19* 19* 21* 21*  21* 21*  GLUCOSE 118* 127* 131* 137* 90 109*  110* 119*    BUN 63* 67* 74* 79* 87* 89*  91* 93*  CREATININE 3.17* 3.24* 3.41* 3.78* 3.81* 4.14*  4.16* 4.07*  CALCIUM 8.6* 8.9 9.2 9.1 8.8* 9.3  9.1 9.0  PHOS 4.4 4.5 5.0* 5.2*  --  5.8*  --    GFR: Estimated Creatinine Clearance: 22.1 mL/min (A) (by C-G formula based on SCr of 4.07 mg/dL (H)).  Liver Function Tests:  Recent Labs Lab 07/25/17 0605 07/26/17 0905 07/27/17 0502 07/28/17 0406 07/29/17 0819  AST  --  26 19 22 22   ALT  --  10* 9* 10* 10*  ALKPHOS  --  37* 34* 37* 40  BILITOT  --  1.9* 1.9* 1.8* 1.7*  PROT  --  6.1* 5.5* 6.0* 5.8*  ALBUMIN 3.1* 3.1* 2.8* 2.9*  2.9* 2.9*    Recent Labs Lab 07/27/17 0502  AMMONIA 50*    Coagulation Profile:  Recent Labs Lab 07/26/17 0904  INR 2.15    HbA1C: Hgb A1c MFr Bld  Date/Time Value Ref Range Status  07/17/2017 06:23 AM 4.5 (L) 4.8 - 5.6 % Final    Comment:    (NOTE) Pre diabetes:          5.7%-6.4% Diabetes:              >6.4% Glycemic control for   <7.0% adults with diabetes     CBG:  Recent Labs Lab 07/28/17 1120 07/28/17 1650 07/28/17 2025 07/29/17 0757 07/29/17 1108  GLUCAP 116* 91 114* 111* 132*    Recent Results (from the past 240 hour(s))  Gram stain     Status: None   Collection Time: 07/19/17  4:42 PM  Result Value Ref Range Status   Specimen Description PERITONEAL CAVITY  Final   Special Requests NONE  Final   Gram Stain   Final    Performed at [facility] NO ORGANISMS SEEN WBC PRESENT,BOTH PMN AND MONONUCLEAR    Report Status 07/19/2017 FINAL  Final  Culture, body fluid-bottle     Status: None   Collection Time: 07/19/17  4:42 PM  Result Value Ref Range Status   Specimen Description ASCITIC COLLECTED BY DOCTOR  Final   Special Requests BOTTLES DRAWN AEROBIC AND ANAEROBIC 10 CC EACH  Final   Culture NO GROWTH 5 DAYS  Final   Report Status 07/24/2017 FINAL  Final     Scheduled Meds: . ciprofloxacin  500 mg Oral Q24H  . feeding supplement (ENSURE ENLIVE)  237 mL Oral TID  BM  . insulin aspart  0-9 Units Subcutaneous TID WC  . lactulose  10 g Oral TID  . polyethylene glycol  17 g Oral BID  . sodium phosphate  1 enema Rectal Once  . torsemide  100 mg Oral Daily  . traZODone  50 mg Oral QHS    LOS: 13 days   Lonia BloodJeffrey T. McClung, MD [facility] Office  [phone] Pager -  Text Page per Loretha Stapler as per below:  On-Call/Text Page:      [email or web address]      password [email or web address]  If 7PM-7AM, please contact night-coverage [email or web address] Password [email or web address] 07/29/2017, 4:21 PM

## 2017-07-29 NOTE — Progress Notes (Signed)
Shawn [name]  MRN: [id number]  DOB/AGE: [birth year] 56 y.o.  Primary Care Physician:Patient, No Pcp Per  Admit date: 07/16/2017  Chief Complaint:  Chief Complaint  Patient presents with  . abdominal distension  . Weakness    S-Pt presented on  07/16/2017 with  Chief Complaint  Patient presents with  . abdominal distension  . Weakness  .    Pt offers no new complaints.     Pt says " Things are the same"     Meds . ciprofloxacin  500 mg Oral Q24H  . feeding supplement (ENSURE ENLIVE)  237 mL Oral TID BM  . insulin aspart  0-9 Units Subcutaneous TID WC  . lactulose  10 g Oral TID  . polyethylene glycol  17 g Oral BID  . torsemide  100 mg Oral Daily  . traZODone  50 mg Oral QHS       Physical Exam: Vital signs in last 24 hours: Temp:  [97.7 F (36.5 C)-98.6 F (37 C)] 98.6 F (37 C) (09/08 0526) Pulse Rate:  [84-96] 96 (09/08 0526) Resp:  [18-20] 18 (09/08 0526) BP: (91-100)/(52-67) 100/63 (09/08 0526) SpO2:  [99 %-100 %] 99 % (09/08 0526) Weight change:  Last BM Date: 07/28/17  Intake/Output from previous day: 09/07 0701 - 09/08 0700 In: 360 [P.O.:360] Out: -  Total I/O In: 120 [P.O.:120] Out: -    Physical Exam: General- pt is awake,alert, follows commands Resp- No acute REsp distress, decreased at bases CVS- S1S2 regular in rate and rhythm GIT- BS+, soft, NT, Distended EXT- 1+ LE Edema, No Cyanosis   Lab Results: HGb  9.0 on 07/25/17   BMET  Recent Labs  07/28/17 0406 07/29/17 0819  NA 135  134* 135  K 5.5*  5.5* 4.7  CL 103  103 105  CO2 21*  21* 21*  GLUCOSE 109*  110* 119*  BUN 89*  91* 93*  CREATININE 4.14*  4.16* 4.07*  CALCIUM 9.3  9.1 9.0    Creat trend 2018  2.4=>2.8=>3.09=>3.4=>3.8=>4.1=>4.0  MICRO Recent Results (from the past 240 hour(s))  Gram stain     Status: None   Collection Time: 07/19/17  4:42 PM  Result Value Ref Range Status   Specimen Description PERITONEAL CAVITY  Final   Special Requests NONE   Final   Gram Stain   Final    Performed at [facility] WBC PRESENT,BOTH PMN AND MONONUCLEAR    Report Status 07/19/2017 FINAL  Final  Culture, body fluid-bottle     Status: None   Collection Time: 07/19/17  4:42 PM  Result Value Ref Range Status   Specimen Description ASCITIC COLLECTED BY DOCTOR  Final   Special Requests BOTTLES DRAWN AEROBIC AND ANAEROBIC 10 CC EACH  Final   Culture NO GROWTH 5 DAYS  Final   Report Status 07/24/2017 FINAL  Final      Lab Results  Component Value Date   CALCIUM 9.0 07/29/2017   PHOS 5.8 (H) 07/28/2017       Auto immune work up Hepb/ hepc/HIV-negative C-ANCA negative P-ANCA negative ANA negative ASO low Anti GBM negative Complements low   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                 AKI on CKD ?  CKD stage not sure. not sure as not much data avaiable               CKD since not sure as not much data available               Pt did no have any labs for past 20+ years                ANCA vasculitis?                  AKI worsening                Creat trending up.                           Renal biopsy risks                INR high                PT high                Inability of pt to lie flat as abdomen distended                                 2)HTN  Medication- Was On RAS blockers. On Diuretics-Lasix.  3)Anemia HGb low Primary team following  4)Liver-admitted with liver cirrhosis Sec to ETOH abuse   5)Resp-admitted with pleural effusion PMD following  6)Electrolytes  Hyperkalemic Now better  Hyponatremic Hypervolemia Hyponatremia sec to cirrhosis Now better  7)Acid base Co2 at goal  8) Hyperphosphatemia    Will follow    Plan:   Pt creat is now at plateau Will continue current care.        [name] S 07/29/2017, 10:48 AM

## 2017-07-29 NOTE — Progress Notes (Signed)
Pt states "I just want to be left alone..I'm not taking any more medicine, everything your doing is too hard on my body." Educated pt on medications but told him we would let him rest tonight and to call if he needs anything, call light within reach.

## 2017-07-30 LAB — CBC
HEMATOCRIT: 25.1 % — AB (ref 39.0–52.0)
HEMOGLOBIN: 8.8 g/dL — AB (ref 13.0–17.0)
MCH: 34.5 pg — AB (ref 26.0–34.0)
MCHC: 35.1 g/dL (ref 30.0–36.0)
MCV: 98.4 fL (ref 78.0–100.0)
PLATELETS: 98 10*3/uL — AB (ref 150–400)
RBC: 2.55 MIL/uL — AB (ref 4.22–5.81)
RDW: 17.3 % — ABNORMAL HIGH (ref 11.5–15.5)
WBC: 8.2 10*3/uL (ref 4.0–10.5)

## 2017-07-30 LAB — BASIC METABOLIC PANEL
Anion gap: 11 (ref 5–15)
BUN: 95 mg/dL — AB (ref 6–20)
CHLORIDE: 103 mmol/L (ref 101–111)
CO2: 22 mmol/L (ref 22–32)
CREATININE: 4.06 mg/dL — AB (ref 0.61–1.24)
Calcium: 9 mg/dL (ref 8.9–10.3)
GFR calc Af Amer: 18 mL/min — ABNORMAL LOW (ref >60)
GFR calc non Af Amer: 15 mL/min — ABNORMAL LOW (ref >60)
GLUCOSE: 138 mg/dL — AB (ref 65–99)
POTASSIUM: 4.5 mmol/L (ref 3.5–5.1)
Sodium: 136 mmol/L (ref 135–145)

## 2017-07-30 LAB — BILIRUBIN, TOTAL: Total Bilirubin: 1.3 mg/dL — ABNORMAL HIGH (ref 0.3–1.2)

## 2017-07-30 LAB — PROTIME-INR
INR: 1.99
PROTHROMBIN TIME: 22.4 s — AB (ref 11.4–15.2)

## 2017-07-30 LAB — GLUCOSE, CAPILLARY
GLUCOSE-CAPILLARY: 127 mg/dL — AB (ref 65–99)
GLUCOSE-CAPILLARY: 122 mg/dL — AB (ref 65–99)
GLUCOSE-CAPILLARY: 141 mg/dL — AB (ref 65–99)
Glucose-Capillary: 115 mg/dL — ABNORMAL HIGH (ref 65–99)

## 2017-07-30 MED ORDER — SIMETHICONE 80 MG PO CHEW: 80.0000 mg | CHEWABLE_TABLET | Freq: Four times a day (QID) | ORAL | Status: DC | PRN

## 2017-07-30 NOTE — Progress Notes (Signed)
Shawn [name]  MRN: [id number]  DOB/AGE: [birth year] 56 y.o.  Primary Care Physician:Patient, No Pcp Per  Admit date: 07/16/2017  Chief Complaint:  Chief Complaint  Patient presents with  . abdominal distension  . Weakness    S-Pt presented on  07/16/2017 with  Chief Complaint  Patient presents with  . abdominal distension  . Weakness  .    Pt offers no new complaints.     Pt says " my  Belly is getting bigger"     Meds . ciprofloxacin  500 mg Oral Q24H  . feeding supplement (ENSURE ENLIVE)  237 mL Oral TID BM  . insulin aspart  0-9 Units Subcutaneous TID WC  . lactulose  10 g Oral TID  . polyethylene glycol  17 g Oral BID  . torsemide  100 mg Oral Daily  . traZODone  50 mg Oral QHS       Physical Exam: Vital signs in last 24 hours: Temp:  [97.9 F (36.6 C)-98 F (36.7 C)] 98 F (36.7 C) (09/09 0600) Pulse Rate:  [93-95] 95 (09/09 0600) Resp:  [16] 16 (09/09 0600) BP: (94-105)/(53-65) 105/65 (09/09 0600) SpO2:  [99 %-100 %] 99 % (09/09 0926) Weight change:  Last BM Date: 07/29/17  Intake/Output from previous day: 09/08 0701 - 09/09 0700 In: 360 [P.O.:360] Out: 200 [Urine:200] No intake/output data recorded.   Physical Exam: General- pt is awake,alert, follows commands Resp- No acute REsp distress, decreased at bases CVS- S1S2 regular in rate and rhythm GIT- BS+, soft, NT, Distended EXT- 1+ LE Edema, No Cyanosis   Lab Results: HGb  9.0 on 07/25/17   BMET  Recent Labs  07/29/17 0819 07/30/17 0659  NA 135 136  K 4.7 4.5  CL 105 103  CO2 21* 22  GLUCOSE 119* 138*  BUN 93* 95*  CREATININE 4.07* 4.06*  CALCIUM 9.0 9.0    Creat trend 2018  2.4=>2.8=>3.09=>3.4=>3.8=>4.1=>4.0  MICRO No results found for this or any previous visit (from the past 240 hour(s)).    Lab Results  Component Value Date   CALCIUM 9.0 07/30/2017   PHOS 5.8 (H) 07/28/2017       Auto immune work up Hepb/ hepc/HIV-negative C-ANCA negative P-ANCA  negative ANA negative ASO low Anti GBM negative Complements low   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                 AKI on CKD ?               CKD stage not sure. not sure as not much data avaiable               CKD since not sure as not much data available               Pt did no have any labs for past 20+ years                ANCA vasculitis?                  AKI worsening                Creat trending up.                           Renal biopsy risks  INR high                PT high                Inability of pt to lie flat as abdomen distended                                 2)HTN  Medication- Was On RAS blockers. On Diuretics-Lasix.  3)Anemia HGb low Primary team following  4)Liver-admitted with liver cirrhosis Sec to ETOH abuse May need tap  5)Resp-admitted with pleural effusion PMD following  6)Electrolytes  Hyperkalemic Now better  Hyponatremic Hypervolemia Hyponatremia sec to cirrhosis Now better  7)Acid base Co2 at goal  8) Hyperphosphatemia    Will follow    Plan:   Pt creat is now at plateau Will continue current care.        Serinity Ware S 07/30/2017, 3:19 PM

## 2017-07-30 NOTE — Progress Notes (Signed)
[facility] TEAM 2  FREAD KOTTKE  JXB:[id number] DOB: 05/01/1961 DOA: 07/16/2017 PCP: Patient, No Pcp Per    Brief Narrative:  56yo M w/ a hx of alcohol abuse who presented with generalized weakness, abdominal distention. Admitted for massive ascites secondary to alcoholic cirrhosis. He was seen by gastroenterology in consultation. He was subsequently diagnosed with spontaneous bacterial peritonitis and treated with a full course of antibiotics and is now on suppressive therapy. Hospitalization has been complicated and prolonged by acute kidney injury which is slowly progressive and of unclear etiology. Further complicating matters is rapidly recurring ascites requiring multiple paracenteses.  Subjective: The pt tells me he had a BM last PM and no longer feels that he is constipated. He denies abdom pain, sob, or chest pain.  He reports that his appetite is improving somewhat.     Assessment & Plan:  Decompensated alcoholic cirrhosis with associated thrombocytopenia and massive volume overload - MELD 29 - stopped drinking alcohol four months prior to this admit  - s/p large volume paracentesis 8/27, 8/29, 8/31, 9/4 - ascites not problematic at this time - following to determine when/if repeat paracentesis will be required  - Palliative recommending an abdom pleurex catheter w/ Gen Surgery making plans for placement - outpatient follow-up with Gastroenterology as planned  Constipation / ?impaction  Appears resolved as of last PM - follow   Acute kidney injury 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 - not a candidate for HD - appears crt has peaked - cont to monitor trend   Recent Labs Lab 07/26/17 0905 07/27/17 0502 07/28/17 0406 07/29/17 0819 07/30/17 0659  CREATININE 3.78* 3.81* 4.14*  4.16* 4.07* 4.06*   SBP - Cultures no growth received a full course of antibiotics - now on suppressive therapy w/ cipro   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  DM2 CBG is presently well controlled  Alcohol abuse in remission  Persistent small bilateral pleural effusions Not clinically significant at the present time - 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 rate 16, height  (1.753 m), weight 86.9 kg (191 lb 9.3 oz), SpO2 99 %.  Intake/Output Summary (Last 24 hours) at 07/30/17 1541 Last data filed at 07/30/17 0600  Gross per 24 hour  Intake              120 ml  Output              200 ml  Net              -80 ml   Filed Weights   07/16/17 2112 07/20/17 0456 07/26/17 0629  Weight: 98.2 kg (216 lb 8 oz) 86.9 kg (191 lb 9.6 oz) 86.9 kg (191 lb 9.3 oz)    Examination: General: No acute respiratory distress - alert  Lungs: CTA B - no wheeze  Cardiovascular: Regular rate and rhythm  Abdomen: Nontender, distended, soft, bowel sounds positive, no rebound - obvious  ascites but not tense  Extremities: 2+ edema B LE   CBC:  Recent Labs Lab 07/25/17 0610 07/30/17 0659  WBC 8.9 8.2  HGB 9.0* 8.8*  HCT 25.4* 25.1*  MCV 96.9 98.4  PLT 70* 98*   Basic Metabolic Panel:  Recent Labs Lab 07/24/17 0609 07/25/17 0605 07/26/17 0905 07/27/17 0502 07/28/17 0406 07/29/17 0819 07/30/17 0659  NA 133* 135 133* 135 135  134* 135 136  K 4.8 5.0 5.2* 5.4* 5.5*  5.5* 4.7 4.5  CL 103 106 102 105 103  103 105 103  CO2 21* 19* 19* 21* 21*   21* 21* 22  GLUCOSE 127* 131* 137* 90 109*  110* 119* 138*  BUN 67* 74* 79* 87* 89*  91* 93* 95*  CREATININE 3.24* 3.41* 3.78* 3.81* 4.14*  4.16* 4.07* 4.06*  CALCIUM 8.9 9.2 9.1 8.8* 9.3  9.1 9.0 9.0  PHOS 4.5 5.0* 5.2*  --  5.8*  --   --    GFR: Estimated Creatinine Clearance: 22.2 mL/min (A) (by C-G formula based on SCr of 4.06 mg/dL (H)).  Liver Function Tests:  Recent Labs Lab 07/25/17 0605 07/26/17 0905 07/27/17 0502 07/28/17 0406 07/29/17 0819 07/30/17 0659  AST  --  26 19 22 22   --   ALT  --  10* 9* 10* 10*  --   ALKPHOS  --  37* 34* 37* 40  --   BILITOT  --  1.9* 1.9* 1.8* 1.7* 1.3*  PROT  --  6.1* 5.5* 6.0* 5.8*  --   ALBUMIN 3.1* 3.1* 2.8* 2.9*  2.9* 2.9*  --     Recent Labs Lab 07/27/17 0502  AMMONIA 50*    Coagulation Profile:  Recent Labs Lab 07/26/17 0904 07/30/17 0659  INR 2.15 1.99    HbA1C: Hgb A1c MFr Bld  Date/Time Value Ref Range Status  07/17/2017 06:23 AM 4.5 (L) 4.8 - 5.6 % Final    Comment:    (NOTE) Pre diabetes:          5.7%-6.4% Diabetes:              >6.4% Glycemic control for   <7.0% adults with diabetes     CBG:  Recent Labs Lab 07/29/17 1108 07/29/17 1627 07/29/17 2049 07/30/17 0755 07/30/17 1141  GLUCAP 132* 121* 170* 127* 122*    Scheduled Meds: . ciprofloxacin  500 mg Oral Q24H  . feeding supplement (ENSURE ENLIVE)  237 mL Oral TID BM  . insulin aspart  0-9 Units Subcutaneous TID WC  . lactulose  10 g Oral TID  . polyethylene glycol  17 g Oral BID  . torsemide  100 mg Oral Daily  . traZODone  50 mg Oral QHS    LOS: 14 days   Lonia BloodJeffrey T. McClung, MD [facility] Office  [phone] Pager - Text Page per Amion as per below:  On-Call/Text Page:      [email or web address]      password [email or web address]  If 7PM-7AM, please contact night-coverage [email or web address] Password [email or web address] 07/30/2017, 3:41 PM

## 2017-07-30 NOTE — Progress Notes (Signed)
  Subjective:  Patient has no complaints. He states he ate 40-50% of his breakfast and he ate less than half of his evening meal. He denies nausea vomiting abdominal pain or shortness of breath. He passed formed  Stool after he was given fleets enema yesterday afternoon. He does not have a sense of rectal fullness or need to have   Objective: Blood pressure 105/65, pulse 95, temperature 98 F (36.7 C), temperature source Oral, resp. rate 16, height 5\' 9"  (1.753 m), weight 191 lb 9.3 oz (86.9 kg), SpO2 99 %. Patient is alert and no acute distress. He has generalized wasting. He does not have asterixis.. Abdomen is distended but not tense or tender. He has trace to 1+ pitting edema around   Labs/studies Results:   Recent Labs  07/30/17 0659  WBC 8.2  HGB 8.8*  HCT 25.1*  PLT 98*    BMET   Recent Labs  07/28/17 0406 07/29/17 0819 07/30/17 0659  NA 135  134* 135 136  K 5.5*  5.5* 4.7 4.5  CL 103  103 105 103  CO2 21*  21* 21* 22  GLUCOSE 109*  110* 119* 138*  BUN 89*  91* 93* 95*  CREATININE 4.14*  4.16* 4.07* 4.06*  CALCIUM 9.3  9.1 9.0 9.0    LFT   Recent Labs  07/28/17 0406 07/29/17 0819 07/30/17 0659  PROT 6.0* 5.8*  --   ALBUMIN 2.9*  2.9* 2.9*  --   AST 22 22  --   ALT 10* 10*  --   ALKPHOS 37* 40  --   BILITOT 1.8* 1.7* 1.3*    PT/INR   Recent Labs  07/30/17 0659  LABPROT 22.4*  INR 1.99      Assessment:  #1. Cirrhotic ascites complicated by SBP for which she has completed IV antibiotic therapy and now on Cipro for secondary prophylaxis. Ascites is reaccumulating slowly.no indication for tap at this time.  #2. Decompensated alcoholic cirrhosis. Hepatic function has improved somewhat as evidenced by drop in INR. MELD remains high at 28. Prognosis remains poor.  #3. Renal dysfunction.etiology unclear. No significant improvement in renal function over the last 48 hours.  #4. Anemia and thrombocytopenia secondary to chronic disease and  cirrhosis respectively.   Recommendations:  Continue Cipro at current dose. Continue supportive therapy. LVAP on as needed basis.

## 2017-07-30 NOTE — Progress Notes (Signed)
Pt refuses to take Lactulose.  Pt thinks it's associated with him "feeling" constipated.  Educated the pt on the importance and medicinal action of lactulose.

## 2017-07-31 ENCOUNTER — Inpatient Hospital Stay (HOSPITAL_COMMUNITY): Payer: No Typology Code available for payment source

## 2017-07-31 LAB — GLUCOSE, CAPILLARY
Glucose-Capillary: 116 mg/dL — ABNORMAL HIGH (ref 65–99)
Glucose-Capillary: 116 mg/dL — ABNORMAL HIGH (ref 65–99)
Glucose-Capillary: 112 mg/dL — ABNORMAL HIGH (ref 65–99)
Glucose-Capillary: 140 mg/dL — ABNORMAL HIGH (ref 65–99)

## 2017-07-31 LAB — BODY FLUID CELL COUNT WITH DIFFERENTIAL
EOS FL: 1 %
LYMPHS FL: 47 %
MONOCYTE-MACROPHAGE-SEROUS FLUID: 42 % — AB (ref 50–90)
NEUTROPHIL FLUID: 10 % (ref 0–25)
OTHER CELLS FL: 0 %
Total Nucleated Cell Count, Fluid: 91 cu mm (ref 0–1000)

## 2017-07-31 LAB — COMPREHENSIVE METABOLIC PANEL
ALK PHOS: 49 U/L (ref 38–126)
ALT: 14 U/L — AB (ref 17–63)
AST: 33 U/L (ref 15–41)
Albumin: 2.6 g/dL — ABNORMAL LOW (ref 3.5–5.0)
Anion gap: 11 (ref 5–15)
BUN: 94 mg/dL — ABNORMAL HIGH (ref 6–20)
CALCIUM: 8.7 mg/dL — AB (ref 8.9–10.3)
CO2: 21 mmol/L — AB (ref 22–32)
CREATININE: 3.85 mg/dL — AB (ref 0.61–1.24)
Chloride: 100 mmol/L — ABNORMAL LOW (ref 101–111)
GFR, EST AFRICAN AMERICAN: 19 mL/min — AB (ref >60)
GFR, EST NON AFRICAN AMERICAN: 16 mL/min — AB (ref >60)
Glucose, Bld: 133 mg/dL — ABNORMAL HIGH (ref 65–99)
Potassium: 4.3 mmol/L (ref 3.5–5.1)
Sodium: 132 mmol/L — ABNORMAL LOW (ref 135–145)
Total Bilirubin: 1.4 mg/dL — ABNORMAL HIGH (ref 0.3–1.2)
Total Protein: 5.9 g/dL — ABNORMAL LOW (ref 6.5–8.1)

## 2017-07-31 LAB — CBC
HCT: 24.1 % — ABNORMAL LOW (ref 39.0–52.0)
Hemoglobin: 8.3 g/dL — ABNORMAL LOW (ref 13.0–17.0)
MCH: 34 pg (ref 26.0–34.0)
MCHC: 34.4 g/dL (ref 30.0–36.0)
MCV: 98.8 fL (ref 78.0–100.0)
PLATELETS: 104 10*3/uL — AB (ref 150–400)
RBC: 2.44 MIL/uL — AB (ref 4.22–5.81)
RDW: 17.3 % — ABNORMAL HIGH (ref 11.5–15.5)
WBC: 7.8 10*3/uL (ref 4.0–10.5)

## 2017-07-31 LAB — GRAM STAIN

## 2017-07-31 LAB — AMMONIA: AMMONIA: 54 umol/L — AB (ref 9–35)

## 2017-07-31 MED ORDER — ALBUMIN HUMAN 25 % IV SOLN
50.0000 g | Freq: Once | INTRAVENOUS | Status: AC
  Administered 2017-07-31: 50 g via INTRAVENOUS
  Filled 2017-07-31: qty 200

## 2017-07-31 NOTE — Plan of Care (Signed)
Problem: Fluid Volume: Goal: Return to normovolemic state will improve Outcome: Not Progressing Patient continues to receive proper treatments for fluid volume excess including paracentesis and PO diuretics, patient has poor PO intake and has a chronic illness exacerbating his fluid volume excess issues

## 2017-07-31 NOTE — Progress Notes (Signed)
Nutrition Follow-up  DOCUMENTATION CODES:   Severe malnutrition in context of chronic illness  INTERVENTION:  - Continue Ensure Enlive po TID, each supplement provides 350 kcal and 20 grams of protein - Provided pt cereal and juice after visit per pt request - Promoted po intake  NUTRITION DIAGNOSIS:   Malnutrition (Severe in Chronic context) related to poor appetite, chronic illness as evidenced by severe depletion of body fat, severe depletion of muscle mass.  Ongoing  GOAL:   Patient will meet greater than or equal to 90% of their needs  Progressing but unmet  MONITOR:   PO intake, Diet advancement, Supplement acceptance, Labs, Weight trends, I & O's  ASSESSMENT:   56 y/o male PMHx etoh abuse (says stopped 4 months ago), HTN, DM. Presented with 3 months of abdominal distension and weakness. CT reveals cirrhosis and massive ascites. Also revealed to have AKI. Admitted for management   Spoke with pt and RN at bedside. RN administering medications. Pt reports a slight increase in po intake since admission. Per chart review pt is consuming more since last RD visit, between 0-75%. Per pt report he is still lacking appetite however is "forcing" food down. RN reports he may be having slight difficulty swallowing at this time.   Pt reports he is consuming less because of all the fluid in his abdominal area, which is distended, stating "a sip for you is a gallon for me".   Pt is s/p many large volume paracentesis on 08/27, 08/29, 08/31, 09/04, and per MD note will repeat today.   Pt reports no current N/V and reports requiring an enema to pass stools yesterday  Pt requested cheerios and grape juice at time of visit, since meal tray was at bedside ~50% complete, RD contacted nutrition ambassador and provided pt with these two items.   RD further discussed importance of PO intake  Most recent weight reading is from 09/05, 5 days ago.   Labs reviewed; CBG: 91-170, Na: 132,  Hemoglobin: 8.3, K: WNL  Medications reviewed; Sliding scale insulin, Miralax, PRN Zofran  Diet Order:  Diet Heart Room service appropriate? Yes; Fluid consistency: Thin  Skin:  Reviewed, no issues  Last BM:  07/30/17  Height:   Ht Readings from Last 1 Encounters:  07/16/17 5\' 9"  (1.753 m)    Weight:   Wt Readings from Last 1 Encounters:  07/26/17 191 lb 9.3 oz (86.9 kg)    Ideal Body Weight:  72.73 kg  BMI:  Body mass index is 28.29 kg/m.  Estimated Nutritional Needs:   Kcal:  2250-2450 kcals (HBE x1.5 +/- 100 kcals)  Protein:  100-116 g Pro (1.4-1.6 g/kg ibw)  Fluid:  Per MD  EDUCATION NEEDS:   Education needs no appropriate at this time  Fransisca Kaufmannllison Ioannides, MS, RDN, LDN 07/31/2017 12:18 PM

## 2017-07-31 NOTE — Progress Notes (Addendum)
TEAM 2  JACARIUS HANDEL  AVW:[id number] DOB: 1960/12/15 DOA: 07/16/2017 PCP: Patient, No Pcp Per    Brief Narrative:  56yo M w/ a hx of alcohol abuse who presented with generalized weakness, abdominal distention. Admitted for massive ascites secondary to alcoholic cirrhosis. He was seen by gastroenterology in consultation. He was subsequently diagnosed with spontaneous bacterial peritonitis and treated with a full course of antibiotics and is now on suppressive therapy. Hospitalization has been complicated and prolonged by acute kidney injury which is slowly progressive and of unclear etiology. Further complicating matters is rapidly recurring ascites requiring multiple paracenteses.  Subjective: Preoperative abdominal is more bloated, appetite slightly improved after having a bowel movement  Assessment & Plan:  Decompensated alcoholic cirrhosis with associated thrombocytopenia and massive volume overload - MELD 29 - stopped drinking alcohol four months prior to this admit  - s/p large volume paracentesis 8/27, 8/29, 8/31, 9/4, will repeat another paracentesis today - ascites not problematic at this time - following to determine when/if repeat paracentesis will be required  - Palliative recommending an abdom pleurex catheter w/ Gen Surgery making plans for placement, likely in next couple of days  - outpatient follow-up with Gastroenterology as planned      Constipation / ?impaction  Appears resolved as of last PM - follow   Acute kidney injury 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 - not a candidate for HD - appears crt has peaked - cont to monitor trend   Recent Labs Lab 07/27/17 0502 07/28/17 0406 07/29/17 0819 07/30/17 0659 07/31/17 0602  CREATININE 3.81* 4.14*  4.16* 4.07* 4.06* 3.85*     SBP - Cultures no growth received a full course of antibiotics - now on suppressive therapy w/ cipro   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  DM2 CBG is presently well controlled  Alcohol abuse in remission  Persistent small bilateral pleural effusions Not clinically significant at the present time - 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 rate 16, height  (1.753 m), weight 86.9 kg (191 lb 9.3 oz), SpO2 100 %.  Intake/Output Summary (Last 24 hours) at 07/31/17 0900 Last data filed at 07/31/17 0200  Gross per 24 hour  Intake              360 ml  Output              550 ml  Net             -190 ml   Filed Weights   07/16/17 2112 07/20/17 0456 07/26/17 0629  Weight: 98.2 kg (216 lb 8 oz) 86.9 kg (191 lb 9.6 oz) 86.9 kg (191 lb 9.3 oz)    Examination: General: No acute respiratory distress - alert  Lungs: CTA B - no wheeze  Cardiovascular: Regular rate and rhythm  Abdomen: Nontender, distended,Fluid shift, soft, bowel sounds positive, no rebound - obvious ascites but not tense  Extremities: 2+ edema B LE  CBC:  Recent Labs Lab 07/25/17 0610 07/30/17 0659 07/31/17 0602  WBC 8.9 8.2 7.8  HGB 9.0* 8.8* 8.3*  HCT 25.4* 25.1* 24.1*  MCV 96.9 98.4 98.8  PLT 70* 98* 104*   Basic Metabolic Panel:  Recent Labs Lab 07/25/17 0605 07/26/17 0905 07/27/17 0502 07/28/17 0406 07/29/17 0819 07/30/17 0659 07/31/17 0602  NA 135 133* 135 135  134* 135 136 132*  K 5.0 5.2* 5.4* 5.5*  5.5* 4.7 4.5 4.3  CL 106 102 105 103   103 105 103 100*  CO2 19* 19* 21* 21*  21* 21* 22 21*  GLUCOSE 131* 137* 90 109*  110* 119* 138* 133*  BUN 74* 79* 87* 89*  91* 93* 95* 94*  CREATININE 3.41* 3.78* 3.81* 4.14*  4.16* 4.07* 4.06* 3.85*  CALCIUM 9.2 9.1 8.8* 9.3  9.1 9.0 9.0 8.7*  PHOS 5.0* 5.2*  --  5.8*  --   --   --    GFR: Estimated Creatinine Clearance: 23.4 mL/min (A) (by C-G formula based on SCr of 3.85 mg/dL (H)).  Liver Function Tests:  Recent Labs Lab 07/26/17 0905 07/27/17 0502 07/28/17 0406 07/29/17 0819 07/30/17 0659 07/31/17 0602  AST 26 19 22 22   --  33  ALT 10* 9* 10* 10*  --  14*  ALKPHOS 37* 34* 37* 40  --  49  BILITOT 1.9* 1.9* 1.8* 1.7* 1.3* 1.4*  PROT 6.1* 5.5* 6.0* 5.8*  --  5.9*  ALBUMIN 3.1* 2.8* 2.9*  2.9* 2.9*  --  2.6*    Recent Labs Lab 07/27/17 0502 07/31/17 0602  AMMONIA 50* 54*    Coagulation Profile:  Recent Labs Lab 07/26/17 0904 07/30/17 0659  INR 2.15 1.99    HbA1C: Hgb A1c MFr Bld  Date/Time Value Ref Range Status  07/17/2017 06:23 AM 4.5 (L) 4.8 - 5.6 % Final    Comment:    (NOTE) Pre diabetes:          5.7%-6.4% Diabetes:              >6.4% Glycemic control for   <7.0% adults with diabetes     CBG:  Recent Labs Lab 07/30/17 0755 07/30/17 1141 07/30/17 1653 07/30/17 2059 07/31/17 0734  GLUCAP 127* 122* 115* 141* 116*    Scheduled Meds: . ciprofloxacin  500 mg Oral Q24H  . feeding supplement (ENSURE ENLIVE)  237 mL Oral TID BM  . insulin aspart  0-9 Units Subcutaneous TID WC  . lactulose  10 g Oral TID  . polyethylene glycol  17 g Oral BID  . torsemide  100 mg Oral Daily  . traZODone  50 mg Oral QHS    LOS: 15 days      On-Call/Text Page:      [email or web address]      password [email or web address]  If 7PM-7AM, please contact night-coverage [email or web address] Password [email or web address] 07/31/2017, 9:00 AM

## 2017-07-31 NOTE — Procedures (Signed)
PreOperative Dx: Alcoholic cirrhosis, ascites Postoperative Dx: Alcoholic cirrhosis, ascites Procedure:   US guided paracentesis Radiologist:  Tyron RussellBoles Anesthesia:  10 ml of1% lidocaine Specimen:  2.2 L of yellow ascitic fluid EBL:   < 1 ml Complications: none

## 2017-07-31 NOTE — Progress Notes (Signed)
Subjective: Interval History: Patient claims feeling better. However he complains about the food. He states he was able to eat food which was brought from home last night. He denies any nausea or vomiting.  Objective: Vital signs in last 24 hours: Temp:  [97.8 F (36.6 C)] 97.8 F (36.6 C) (09/09 1551) Pulse Rate:  [86-88] 87 (09/10 0500) Resp:  [16] 16 (09/10 0500) BP: (101-111)/(62-65) 111/65 (09/10 0500) SpO2:  [99 %-100 %] 100 % (09/10 0500) Weight change:   Intake/Output from previous day: 09/09 0701 - 09/10 0700 In: 360 [P.O.:360] Out: 550 [Urine:550] Intake/Output this shift: No intake/output data recorded.  General appearance: alert, cooperative and no distress Resp: diminished breath sounds bilaterally Cardio: regular rate and rhythm GI: Distended, nontender Extremities: edema 2+ edema bilaterally  Lab Results:  Recent Labs  07/30/17 0659 07/31/17 0602  WBC 8.2 7.8  HGB 8.8* 8.3*  HCT 25.1* 24.1*  PLT 98* 104*   BMET:   Recent Labs  07/30/17 0659 07/31/17 0602  NA 136 132*  K 4.5 4.3  CL 103 100*  CO2 22 21*  GLUCOSE 138* 133*  BUN 95* 94*  CREATININE 4.06* 3.85*  CALCIUM 9.0 8.7*   No results for input(s): PTH in the last 72 hours. Iron Studies:  No results for input(s): IRON, TIBC, TRANSFERRIN, FERRITIN in the last 72 hours.  Studies/Results: No results found.  I have reviewed the patient's current medications.  Assessment/Plan: Problem #1 renal failure: Most likely chronic . Etiology not clear but hypertension/ ATN/paraprothenmia .Patient had about 500 mL of urine output. His renal function is stable. His creatinine showing slight improvement. Problem #2 history of liver cirrhosis: Patient with significant anasarca. Patient presently on albumin, IV Lasix and Aldactone. His urine output is not documented. Patient states that at times he becomes incontinent when sleeping. Problem #3 anemia: His hemoglobin is below target goal and  declining.. Problem #4 hypertension: His blood pressure is reasonably controlled Problem #5 history of diabetes: His blood sugar is reasonably controlled Problem #6 bone and mineral disorder: His calcium and phosphorus is in range. 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 Plan: 1] will continue his present management. Patient overall was poor prognosis. 2]Renal panel in am    LOS: 15 days   Sage Hammill S 07/31/2017,9:10 AM

## 2017-07-31 NOTE — Care Management (Signed)
Pt planning to DC home with hospice service, pending accepting [facility]. Pt will need pleurex drain placed prior to DC. CM has contacted [facility] and cancelled referrals. [facility] rep, Leonette MostCharles, aware pt will need hospital bed RW prior to DC.

## 2017-07-31 NOTE — Progress Notes (Signed)
    Subjective: Had mild discomfort LUQ early yesterday evening. Felt like "gas". No N/V. Didn't eat dinner yesterday evening. Doesn't like the hospital food.   Objective: Vital signs in last 24 hours: Temp:  [97.8 F (36.6 C)] 97.8 F (36.6 C) (09/09 1551) Pulse Rate:  [86-88] 87 (09/10 0500) Resp:  [16] 16 (09/10 0500) BP: (101-111)/(62-65) 111/65 (09/10 0500) SpO2:  [99 %-100 %] 100 % (09/10 0500) Last BM Date: 07/30/17 General:   Alert and oriented, chronically-ill appearing Head:  Normocephalic and atraumatic. Abdomen:  Bowel sounds present, non-tense ascites, small umbilical hernia Extremities:  With pedal edema Neurologic:  Alert and  oriented x4 Psych:  Alert and cooperative. Flat affect  Intake/Output from previous day: 09/09 0701 - 09/10 0700 In: 360 [P.O.:360] Out: 550 [Urine:550] Intake/Output this shift: No intake/output data recorded.  Lab Results:  Recent Labs  07/30/17 0659 07/31/17 0602  WBC 8.2 7.8  HGB 8.8* 8.3*  HCT 25.1* 24.1*  PLT 98* 104*   BMET  Recent Labs  07/29/17 0819 07/30/17 0659 07/31/17 0602  NA 135 136 132*  K 4.7 4.5 4.3  CL 105 103 100*  CO2 21* 22 21*  GLUCOSE 119* 138* 133*  BUN 93* 95* 94*  CREATININE 4.07* 4.06* 3.85*  CALCIUM 9.0 9.0 8.7*   LFT  Recent Labs  07/29/17 0819 07/30/17 0659 07/31/17 0602  PROT 5.8*  --  5.9*  ALBUMIN 2.9*  --  2.6*  AST 22  --  33  ALT 10*  --  14*  ALKPHOS 40  --  49  BILITOT 1.7* 1.3* 1.4*   PT/INR  Recent Labs  07/30/17 0659  LABPROT 22.4*  INR 1.99    Assessment: 56 year old male with decompensated cirrhosis, SBP, completing course of IV Rocephin. Started on Bactrim for further SBP prophylaxis this hospitalization and changed to Cipro per hospitalist due to concern for worsening renal failure. Has undergone 4 LVAPs this admission. May need repeat paracentesis in next 24-48 hours. Palliative care had discussed PleurX drain for palliative relief: surgery consulted.    Renal failure: creatinine slightly improved, followed by nephrology.   Poor prognosis. Will follow peripherally as no further recommendations to be made.    Plan: Continue SBP prophylaxis Surgery consult for PleurX drain Supportive measures Will follow peripherally    Gelene MinkAnna W. Tavin Vernet, PhD, ANP-BC [facility]    LOS: 15 days    07/31/2017, 8:10 AM

## 2017-08-01 LAB — COMPREHENSIVE METABOLIC PANEL
ALT: 14 U/L — ABNORMAL LOW (ref 17–63)
AST: 28 U/L (ref 15–41)
Albumin: 2.9 g/dL — ABNORMAL LOW (ref 3.5–5.0)
Alkaline Phosphatase: 43 U/L (ref 38–126)
Anion gap: 11 (ref 5–15)
BUN: 90 mg/dL — ABNORMAL HIGH (ref 6–20)
CHLORIDE: 101 mmol/L (ref 101–111)
CO2: 21 mmol/L — ABNORMAL LOW (ref 22–32)
Calcium: 8.6 mg/dL — ABNORMAL LOW (ref 8.9–10.3)
Creatinine, Ser: 3.6 mg/dL — ABNORMAL HIGH (ref 0.61–1.24)
GFR, EST AFRICAN AMERICAN: 20 mL/min — AB (ref >60)
GFR, EST NON AFRICAN AMERICAN: 17 mL/min — AB (ref >60)
Glucose, Bld: 103 mg/dL — ABNORMAL HIGH (ref 65–99)
POTASSIUM: 3.9 mmol/L (ref 3.5–5.1)
SODIUM: 133 mmol/L — AB (ref 135–145)
Total Bilirubin: 1.8 mg/dL — ABNORMAL HIGH (ref 0.3–1.2)
Total Protein: 5.8 g/dL — ABNORMAL LOW (ref 6.5–8.1)

## 2017-08-01 LAB — GLUCOSE, CAPILLARY
Glucose-Capillary: 102 mg/dL — ABNORMAL HIGH (ref 65–99)
Glucose-Capillary: 174 mg/dL — ABNORMAL HIGH (ref 65–99)
Glucose-Capillary: 128 mg/dL — ABNORMAL HIGH (ref 65–99)
Glucose-Capillary: 127 mg/dL — ABNORMAL HIGH (ref 65–99)

## 2017-08-01 NOTE — Progress Notes (Signed)
Temporarily have scheduled PleurX catheter placement for 08/03/17.

## 2017-08-01 NOTE — Care Management Note (Signed)
Case Management Note  Patient Details  Name: Shawn JourdainDavid L [name] MRN: [id number] Date of Birth: 12/09/1960  If discussed at Long Length of Stay Meetings, dates discussed:  08/01/2017  Malcolm Metrohildress, Laurens Matheny Demske, RN 08/01/2017, 3:50 PM

## 2017-08-01 NOTE — Care Management (Addendum)
[facility] rep, Leonette MostCharles, has contacted, [facility], pt not services connected and has no connection with [facility]. They are willing to consider paying for hospice services. CM has faxed pt into and Dr. Susie CassetteAbrol is calling [facility] Crist[facility]son [facility].   [phone] ext [phone] Fax-[phone]  Addendum 1500: above contact info is for Asher MuirJamie - palliative social worker [facility]. She has faxed health benefits update form clarifying pt's income. Pt has signed and CM [place]. CM awaiting final decision to approve hospice services. CM as also been contacted by Kirstie MirzaSamantha Williams with the social security office (Pt asked if I would speak with them) and has provided list of pt's medications via fax to facilitate disability approval.  Contact info for Kirstie MirzaSamantha Williams  Phone [phone] ext [phone] Fax [phone]

## 2017-08-01 NOTE — Progress Notes (Signed)
Subjective: Interval History: Patient feels much better except weakness. Still he doesn't have any nausea or vomiting but appetite is poor.  Objective: Vital signs in last 24 hours: Temp:  [97.6 F (36.4 C)-98.4 F (36.9 C)] 98.4 F (36.9 C) (09/11 0643) Pulse Rate:  [80-96] 96 (09/11 0643) Resp:  [18-19] 19 (09/11 0643) BP: (97-102)/(54-63) 97/54 (09/11 0643) SpO2:  [100 %] 100 % (09/11 0643) Weight change:   Intake/Output from previous day: 09/10 0701 - 09/11 0700 In: 720 [P.O.:720] Out: 1000 [Urine:1000] Intake/Output this shift: No intake/output data recorded.  General appearance: alert, cooperative and no distress Resp: diminished breath sounds bilaterally Cardio: regular rate and rhythm GI: Distended, nontender Extremities: edema 2+ edema bilaterally  Lab Results:  Recent Labs  07/30/17 0659 07/31/17 0602  WBC 8.2 7.8  HGB 8.8* 8.3*  HCT 25.1* 24.1*  PLT 98* 104*   BMET:   Recent Labs  07/31/17 0602 08/01/17 0422  NA 132* 133*  K 4.3 3.9  CL 100* 101  CO2 21* 21*  GLUCOSE 133* 103*  BUN 94* 90*  CREATININE 3.85* 3.60*  CALCIUM 8.7* 8.6*   No results for input(s): PTH in the last 72 hours. Iron Studies:  No results for input(s): IRON, TIBC, TRANSFERRIN, FERRITIN in the last 72 hours.  Studies/Results: [place] Paracentesis  Result Date: 07/31/2017 INDICATION: Alcoholic cirrhosis, recurrent ascites EXAM: ULTRASOUND GUIDED DIAGNOSTIC PARACENTESIS MEDICATIONS: None. COMPLICATIONS: None immediate. PROCEDURE: Procedure, benefits, and risks of procedure were discussed with patient. Written informed consent for procedure was obtained. Time out protocol followed. Adequate collection of ascites localized by ultrasound in RIGHT lower quadrant. Skin prepped and draped in usual sterile fashion. Skin and soft tissues anesthetized with 10 mL of 1% lidocaine. 5 [place] Y[place]h catheter placed into peritoneal cavity. 2.2 L of ascitic fluid aspirated by vacuum bottle suction.  Procedure tolerated well by patient without immediate complication. FINDINGS: As above IMPRESSION: Successful ultrasound-guided paracentesis yielding 2.2 liters of peritoneal fluid. Electronically Signed   By: Ulyses SouthwardMark  Boles M.D.   On: 07/31/2017 13:25    I have reviewed the patient's current medications.  Assessment/Plan: Problem #1 renal failure: Most likely chronic . Etiology not clear but hypertension/ ATN/paraprothenmia .his creatinine is 3.60 and his renal function very slowly improving. He had about 800 mL of urine output. Problem #2 history of liver cirrhosis: Patient with anasarca requiring repeated paracentesis. Problem #3 anemia: His hemoglobin is below target goal and declining.. Problem #4 hypertension: His blood pressure is reasonably controlled Problem #5 history of diabetes: His blood sugar is reasonably controlled Problem #6 bone and mineral disorder: His calcium  is in range but his phosphorus is 5.8 which is higher than our target goal. Presently he is not in a binder. Phosphorus is diet-controlled. 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 Plan: 1] will continue his present management.  2]Renal panel in am    LOS: 16 days   [place] Kirchner S 08/01/2017,8:42 AM

## 2017-08-01 NOTE — Progress Notes (Signed)
[facility] TEAM 2  Shawn JourdainDavid L [name]  ZOX:[id number] DOB: 05/19/1961 DOA: 07/16/2017 PCP: Patient, No Pcp Per    Brief Narrative:  56yo M w/ a hx of alcohol abuse who presented with generalized weakness, abdominal distention. Admitted for massive ascites secondary to alcoholic cirrhosis. He was seen by gastroenterology in consultation. He was subsequently diagnosed with spontaneous bacterial peritonitis and treated with a full course of antibiotics and is now on suppressive therapy. Hospitalization has been complicated and prolonged by acute kidney injury which is slowly progressive and of unclear etiology. Further complicating matters is rapidly recurring ascites requiring multiple paracenteses.  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 and massive volume overload - MELD 29 - stopped drinking alcohol four months prior to this admit  - s/p large volume paracentesis 8/27, 8/29, 8/31, 9/4, 9/10  - Palliative recommending an abdom pleurex catheter w/ Gen Surgery making plans for placement, likely in next couple of days  - outpatient follow-up with Gastroenterology as planned      Constipation / ?impaction  Appears resolved as of last PM - follow    Acute kidney injury 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 - not a candidate for HD - appears crt has peaked - cont to monitor trend   Recent Labs Lab 07/28/17 0406 07/29/17 0819 07/30/17 0659 07/31/17 0602 08/01/17 0422  CREATININE 4.14*  4.16* 4.07* 4.06* 3.85* 3.60*     SBP - Cultures no growth received a full course of antibiotics - now on suppressive therapy w/ cipro   Polyclonal gammopathy Follow up with Oncology as an outpatient  DM2 CBG is presently well controlled  Alcohol abuse in remission  Persistent small bilateral pleural effusions Not clinically significant at the present  time - 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  Severe malnutrition B12 and folate levels adequate  Anemia of chronic disease Will follow CBC, transfuse for hemoglobin less than 7.0    DVT prophylaxis: SCDs Code Status: DNR - NO CODE Family Communication: no family present at time of exam  Disposition Plan: For Pleurx catheter placement, anticipate discharge home with hospice  Consultants:  GI Gen Surgery  Nephrology   Procedures:  Large volume paracentesis 8/27  Large volume paracentesis 8/29  Large volume paracentesis 8/31  Large-volume paracentesis 9/4  Antimicrobials:  Ceftriaxone 8/26 > 9/2 Cipro 9/6 >  Objective: Blood pressure (!) 97/54, pulse 96, temperature 98.4 F (36.9 C), temperature source Oral, resp. rate 19, height 5\' 9"  (1.753 m), weight 86.9 kg (191 lb 9.3 oz), SpO2 100 %.  Intake/Output Summary (Last 24 hours) at 08/01/17 1016 Last data filed at 08/01/17 [id number]  Gross per 24 hour  Intake              720 ml  Output             1000 ml  Net             -280 ml   Filed Weights   07/16/17 2112 07/20/17 0456 07/26/17 0629  Weight: 98.2 kg (216 lb 8 oz) 86.9 kg (191 lb 9.6 oz) 86.9 kg (191 lb 9.3 oz)    Examination: General: No acute respiratory distress - alert  Lungs: CTA B - no wheeze  Cardiovascular: Regular rate and rhythm  Abdomen: Nontender, distended,Fluid shift, soft, bowel sounds positive, no rebound - obvious ascites but not tense  Extremities: 2+ edema B LE  CBC:  Recent Labs Lab 07/30/17 0659 07/31/17 0602  WBC 8.2 7.8  HGB 8.8* 8.3*  HCT 25.1* 24.1*  MCV 98.4 98.8  PLT 98* 104*   Basic Metabolic Panel:  Recent Labs Lab 07/26/17 0905  07/28/17 0406 07/29/17 0819 07/30/17 0659 07/31/17 0602 08/01/17 0422  NA 133*  < > 135  134* 135 136 132* 133*  K 5.2*  < > 5.5*  5.5* 4.7 4.5 4.3 3.9  CL 102  < > 103  103 105 103 100* 101  CO2 19*  < > 21*  21* 21* 22 21* 21*  GLUCOSE 137*  < > 109*  110* 119* 138* 133*  103*  BUN 79*  < > 89*  91* 93* 95* 94* 90*  CREATININE 3.78*  < > 4.14*  4.16* 4.07* 4.06* 3.85* 3.60*  CALCIUM 9.1  < > 9.3  9.1 9.0 9.0 8.7* 8.6*  PHOS 5.2*  --  5.8*  --   --   --   --   < > = values in this interval not displayed. GFR: Estimated Creatinine Clearance: 25 mL/min (A) (by C-G formula based on SCr of 3.6 mg/dL (H)).  Liver Function Tests:  Recent Labs Lab 07/27/17 0502 07/28/17 0406 07/29/17 0819 07/30/17 0659 07/31/17 0602 08/01/17 0422  AST --  33 28  ALT 9* 10* 10*  --  14* 14*  ALKPHOS 34* 37* 40  --  49 43  BILITOT 1.9* 1.8* 1.7* 1.3* 1.4* 1.8*  PROT 5.5* 6.0* 5.8*  --  5.9* 5.8*  ALBUMIN 2.8* 2.9*  2.9* 2.9*  --  2.6* 2.9*    Recent Labs Lab 07/27/17 0502 07/31/17 0602  AMMONIA 50* 54*    Coagulation Profile:  Recent Labs Lab 07/26/17 0904 07/30/17 0659  INR 2.15 1.99    HbA1C: Hgb A1c MFr Bld  Date/Time Value Ref Range Status  07/17/2017 06:23 AM 4.5 (L) 4.8 - 5.6 % Final    Comment:    (NOTE) Pre diabetes:          5.7%-6.4% Diabetes:              >6.4% Glycemic control for   <7.0% adults with diabetes     CBG:  Recent Labs Lab 07/31/17 0734 07/31/17 1130 07/31/17 1640 07/31/17 1924 08/01/17 0805  GLUCAP 116* 116* 112* 140* 102*    Scheduled Meds: . ciprofloxacin  500 mg Oral Q24H  . feeding supplement (ENSURE ENLIVE)  237 mL Oral TID BM  . insulin aspart  0-9 Units Subcutaneous TID WC  . lactulose  10 g Oral TID  . polyethylene glycol  17 g Oral BID  . torsemide  100 mg Oral Daily  . traZODone  50 mg Oral QHS    LOS: 16 days      On-Call/Text Page:      [email or web address]      password [email or web address]  If 7PM-7AM, please contact night-coverage [email or web address] Password [email or web address] 08/01/2017, 10:16 AM

## 2017-08-01 NOTE — Progress Notes (Signed)
Physical Therapy Treatment Patient Details Name: Shawn [name] MRN: [id number] DOB: [birth year] Today's Date: 08/01/2017    History of Present Illness Shawn [name]  is a 56 y.o. male, With history of alcohol abuse, hypertension who came to hospital with complaints of generalized weakness and abdominal distention for past 3 months. Patient says that he quit drinking 4 months ago but he has been drinking almost all his life. Patient says he does not visit doctors and does not take any medications. He does have diabetes but has been taking care of diabetes by diet alone. He denies nausea vomiting or diarrhea. No fever. No chills. He complains of shortness of breath. No chest pain.    PT Comments    Patient demonstrating improvement for functional mobility and gait (see below).  Patient will benefit from continued physical therapy in hospital and recommended venue below to increase strength, balance, endurance for safe ADLs and gait.  Follow Up Recommendations  Supervision/Assistance - 24 hour;SNF     Equipment Recommendations  Rolling walker with 5" wheels    Recommendations for Other Services       Precautions / Restrictions Precautions Precautions: Fall Precaution Comments: minor  Restrictions Weight Bearing Restrictions: No    Mobility  Bed Mobility Overal bed mobility: Needs Assistance Bed Mobility: Supine to Sit;Sit to Supine     Supine to sit: Supervision Sit to supine: Supervision   General bed mobility comments: improvement for moving legs off bed  Transfers Overall transfer level: Needs assistance Equipment used: Rolling walker (2 wheeled) Transfers: Sit to/from [employer] Sit to Stand: Supervision Stand pivot transfers: Supervision          Ambulation/Gait Ambulation/Gait assistance: Supervision Ambulation Distance (Feet): 65 Feet Assistive device: Rolling walker (2 wheeled) Gait Pattern/deviations: Decreased step length - left;Decreased  stance time - right;Decreased stride length   Gait velocity interpretation: Below normal speed for age/gender General Gait Details: increased tolerance for gait training   Stairs            Wheelchair Mobility    Modified Rankin (Stroke Patients Only)       Balance Overall balance assessment: Needs assistance Sitting-balance support: Feet supported Sitting balance-Leahy Scale: Good     Standing balance support: Bilateral upper extremity supported;During functional activity Standing balance-Leahy Scale: Fair                              Cognition Arousal/Alertness: Awake/alert Behavior During Therapy: WFL for tasks assessed/performed Overall Cognitive Status: Within Functional Limits for tasks assessed                                        Exercises General Exercises - Lower Extremity Ankle Circles/Pumps: AROM;Seated;Both;10 reps Long Arc Quad: Seated;AROM;Both;10 reps    General Comments        Pertinent Vitals/Pain Pain Assessment: No/denies pain    Home Living                      Prior Function            PT Goals (current goals can now be found in the care plan section) Acute Rehab PT Goals Patient Stated Goal: Return home with assistance PT Goal Formulation: With patient Time For Goal Achievement: 08/04/17 Potential to Achieve Goals: Good Progress towards PT goals: Progressing toward goals  Frequency    Min 3X/week      PT Plan Current plan remains appropriate    Co-evaluation              AM-PAC PT "6 Clicks" Daily Activity  Outcome Measure  Difficulty turning over in bed (including adjusting bedclothes, sheets and blankets)?: None Difficulty moving from lying on back to sitting on the side of the bed? : None Difficulty sitting down on and standing up from a chair with arms (e.g., wheelchair, bedside commode, etc,.)?: None Help needed moving to and from a bed to chair (including a  wheelchair)?: A Little Help needed walking in hospital room?: A Little Help needed climbing 3-5 steps with a railing? : A Little 6 Click Score: 21    End of Session Equipment Utilized During Treatment: Gait belt Activity Tolerance: Patient tolerated treatment well Patient left:  (patient left standing at bedside wiping his bottom due to diarrhea) Nurse Communication: Mobility status PT Visit Diagnosis: Unsteadiness on feet (R26.81);Other abnormalities of gait and mobility (R26.89);Muscle weakness (generalized) (M62.81)     Time: [id number] PT Time Calculation (min) (ACUTE ONLY): 31 min  Charges:  $Gait Training: 23-37 mins                    G Codes:       4:32 PM, 08/01/17 Ocie BobJames Sherrell Weir, MPT Physical Therapist with [facility] 336 [phone] office [phone] mobile phone

## 2017-08-02 LAB — BASIC METABOLIC PANEL WITH GFR
Anion gap: 9 (ref 5–15)
BUN: 93 mg/dL — ABNORMAL HIGH (ref 6–20)
CO2: 22 mmol/L (ref 22–32)
Calcium: 8.4 mg/dL — ABNORMAL LOW (ref 8.9–10.3)
Chloride: 100 mmol/L — ABNORMAL LOW (ref 101–111)
Creatinine, Ser: 3.45 mg/dL — ABNORMAL HIGH (ref 0.61–1.24)
GFR calc Af Amer: 21 mL/min — ABNORMAL LOW
GFR calc non Af Amer: 18 mL/min — ABNORMAL LOW
Glucose, Bld: 169 mg/dL — ABNORMAL HIGH (ref 65–99)
Potassium: 3.4 mmol/L — ABNORMAL LOW (ref 3.5–5.1)
Sodium: 131 mmol/L — ABNORMAL LOW (ref 135–145)

## 2017-08-02 LAB — GLUCOSE, CAPILLARY
GLUCOSE-CAPILLARY: 118 mg/dL — AB (ref 65–99)
Glucose-Capillary: 155 mg/dL — ABNORMAL HIGH (ref 65–99)
Glucose-Capillary: 144 mg/dL — ABNORMAL HIGH (ref 65–99)
Glucose-Capillary: 181 mg/dL — ABNORMAL HIGH (ref 65–99)

## 2017-08-02 LAB — CBC
HCT: 23.2 % — ABNORMAL LOW (ref 39.0–52.0)
Hemoglobin: 8 g/dL — ABNORMAL LOW (ref 13.0–17.0)
MCH: 33.9 pg (ref 26.0–34.0)
MCHC: 34.5 g/dL (ref 30.0–36.0)
MCV: 98.3 fL (ref 78.0–100.0)
Platelets: 84 K/uL — ABNORMAL LOW (ref 150–400)
RBC: 2.36 MIL/uL — ABNORMAL LOW (ref 4.22–5.81)
RDW: 17.6 % — ABNORMAL HIGH (ref 11.5–15.5)
WBC: 7 K/uL (ref 4.0–10.5)

## 2017-08-02 MED ORDER — POTASSIUM CHLORIDE 20 MEQ PO PACK
40.0000 meq | PACK | Freq: Once | ORAL | Status: AC
  Administered 2017-08-02: 40 meq via ORAL
  Filled 2017-08-02: qty 2

## 2017-08-02 NOTE — Care Management (Signed)
[facility] contacted [facility]. [facility] has been [facility] for [facility] to [facility] for [facility]ice. MD still has to place order but he is expected to do so today. CM has asked [facility] to begin DME order for hospital bed, RW as soon [facility]

## 2017-08-02 NOTE — Progress Notes (Signed)
Shawn [name]  MRN: [id number]  DOB/AGE: 1960-11-26 56 y.o.  Primary Care Physician:Patient, No Pcp Per  Admit date: 07/16/2017  Chief Complaint:  Chief Complaint  Patient presents with  . abdominal distension  . Weakness    S-Pt presented on  07/16/2017 with  Chief Complaint  Patient presents with  . abdominal distension  . Weakness  .    Pt offers no new complaints.      Meds . ciprofloxacin  500 mg Oral Q24H  . feeding supplement (ENSURE ENLIVE)  237 mL Oral TID BM  . insulin aspart  0-9 Units Subcutaneous TID WC  . lactulose  10 g Oral TID  . polyethylene glycol  17 g Oral BID  . torsemide  100 mg Oral Daily  . traZODone  50 mg Oral QHS       Physical Exam: Vital signs in last 24 hours: Temp:  [97.7 F (36.5 C)-97.8 F (36.6 C)] 97.7 F (36.5 C) (09/12 0535) Pulse Rate:  [88-112] 101 (09/12 0535) Resp:  [16-20] 20 (09/12 0535) BP: (89-109)/(50-62) 109/62 (09/12 0535) SpO2:  [98 %-100 %] 100 % (09/12 0535) Weight change:  Last BM Date: 08/01/17  Intake/Output from previous day: 09/11 0701 - 09/12 0700 In: 840 [P.O.:840] Out: -  Total I/O In: 240 [P.O.:240] Out: -    Physical Exam: General- pt is awake,alert, follows commands Resp- No acute REsp distress, decreased at bases CVS- S1S2 regular in rate and rhythm GIT- BS+, soft, NT, Distended EXT- 1+ LE Edema, No Cyanosis   Lab Results: HGb  9.0 on 07/25/17   BMET  Recent Labs  08/01/17 0422 08/02/17 0635  NA 133* 131*  K 3.9 3.4*  CL 101 100*  CO2 21* 22  GLUCOSE 103* 169*  BUN 90* 93*  CREATININE 3.60* 3.45*  CALCIUM 8.6* 8.4*    Creat trend 2018  2.4=>2.8=>3.09=>3.4=>3.8=>4.1=>4.0=> 3.6=>3.45  MICRO Recent Results (from the past 240 hour(s))  Gram stain     Status: None   Collection Time: 07/31/17 12:20 PM  Result Value Ref Range Status   Specimen Description ASCITIC  Final   Special Requests NONE  Final   Gram Stain   Final    CYTOSPIN SMEAR NO ORGANISMS SEEN WBC  SEEN WBC PRESENT,BOTH PMN AND MONONUCLEAR    Report Status 07/31/2017 FINAL  Final  Culture, body fluid-bottle     Status: None (Preliminary result)   Collection Time: 07/31/17 12:20 PM  Result Value Ref Range Status   Specimen Description ASCITIC COLLECTED BY DOCTOR  Final   Special Requests BOTTLES DRAWN AEROBIC AND ANAEROBIC 10 CC EACH  Final   Culture NO GROWTH < 24 HOURS  Final   Report Status PENDING  Incomplete      Lab Results  Component Value Date   CALCIUM 8.4 (L) 08/02/2017   PHOS 5.8 (H) 07/28/2017       Auto immune work up Hepb/ hepc/HIV-negative C-ANCA negative P-ANCA negative ANA negative ASO low Anti GBM negative Complements low   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                 AKI on CKD ?               CKD stage not sure. not sure as not much data avaiable               CKD  since not sure as not much data available               Pt did no have any labs for past 20+ years                ANCA vasculitis?                  AKI now improving                Creat trending down                           Renal biopsy risks                INR high                PT high                Inability of pt to lie flat as abdomen distended                                 2)HTN  Medication- Was On RAS blockers. On Diuretics-Lasix.  3)Anemia HGb low Primary team following  4)Liver-admitted with liver cirrhosis Sec to ETOH abuse May need tap  5)Resp-admitted with pleural effusion PMD following  6)Electrolytes  Hyperkalemic Now better  Hyponatremic Hypervolemia Hyponatremia sec to cirrhosis better  7)Acid base Co2 at goal    Plan:   Pt creat now better Will continue current care.        BHUTANI,MANPREET S 08/02/2017, 9:33 AM

## 2017-08-02 NOTE — Progress Notes (Signed)
Physical Therapy Treatment Patient Details Name: Shawn [name] MRN: [id number] DOB: 11-29-1960 Today's Date: 08/02/2017    History of Present Illness Shawn [name]  is a 56 y.o. male, With history of alcohol abuse, hypertension who came to hospital with complaints of generalized weakness and abdominal distention for past 3 months. Patient says that he quit drinking 4 months ago but he has been drinking almost all his life. Patient says he does not visit doctors and does not take any medications. He does have diabetes but has been taking care of diabetes by diet alone. He denies nausea vomiting or diarrhea. No fever. No chills. He complains of shortness of breath. No chest pain.    PT Comments    Pt supine in bed and willing to participate with therapist today.  Pt reports difficulty sleeping last night and is limited by fatigue with tasks today.  Pt completed all therex with good form and control.  Increased distance with gait training with min guard, no LOB episodes noted but was limited by fatgiue.  Pt left in bed upon request with call bell within reach.   Follow Up Recommendations        Equipment Recommendations  Rolling walker with 5" wheels    Recommendations for Other Services       Precautions / Restrictions      Mobility  Bed Mobility Overal bed mobility: Needs Assistance Bed Mobility: Supine to Sit;Sit to Supine     Supine to sit: Supervision Sit to supine: Supervision   General bed mobility comments: improvement for moving legs off bed  Transfers Overall transfer level: Needs assistance Equipment used: Rolling walker (2 wheeled) Transfers: Sit to/from Stand (elevated bed height) Sit to Stand: Supervision         General transfer comment: verbal cues for proper hand placement with sit to stand  Ambulation/Gait Ambulation/Gait assistance: Supervision Ambulation Distance (Feet): 180 Feet Assistive device: Rolling walker (2 wheeled) Gait Pattern/deviations:  Decreased step length - left;Decreased stance time - right;Decreased stride length     General Gait Details: increased tolerance for gait training   Stairs            Wheelchair Mobility    Modified Rankin (Stroke Patients Only)       Balance                                            Cognition Arousal/Alertness: Awake/alert Behavior During Therapy: WFL for tasks assessed/performed Overall Cognitive Status: Within Functional Limits for tasks assessed                                        Exercises Total Joint Exercises Ankle Circles/Pumps: AROM;10 reps;Both;Seated Long Arc Quad: AROM;10 reps;Both;Seated Other Exercises Other Exercises: 3 STS for strengthening RW infront minimal use of HHA    General Comments        Pertinent Vitals/Pain Pain Assessment: No/denies pain    Home Living                      Prior Function            PT Goals (current goals can now be found in the care plan section)      Frequency  PT Plan Current plan remains appropriate    Co-evaluation              AM-PAC PT "6 Clicks" Daily Activity  Outcome Measure  Difficulty turning over in bed (including adjusting bedclothes, sheets and blankets)?: None Difficulty moving from lying on back to sitting on the side of the bed? : None Difficulty sitting down on and standing up from a chair with arms (e.g., wheelchair, bedside commode, etc,.)?: None Help needed moving to and from a bed to chair (including a wheelchair)?: A Little Help needed walking in hospital room?: A Little Help needed climbing 3-5 steps with a railing? : A Little 6 Click Score: 21    End of Session Equipment Utilized During Treatment: Gait belt Activity Tolerance: Patient tolerated treatment well Patient left: in bed;with call bell/phone within reach Nurse Communication: Mobility status PT Visit Diagnosis: Unsteadiness on feet (R26.81);Other  abnormalities of gait and mobility (R26.89);Muscle weakness (generalized) (M62.81)     Time: 1340-1403 PT Time Calculation (min) (ACUTE ONLY): 23 min  Charges:  $Gait Training: 8-22 mins $Therapeutic Exercise: 8-22 mins                    G Codes:      Shawn [name], LPTA; CBIS [phone]  Shawn [name], Shawn [name] 08/02/2017, 3:51 PM

## 2017-08-02 NOTE — Progress Notes (Addendum)
[facility] TEAM 2  Ardeen JourdainDavid L Stump  ZOX:[id number] DOB: 09/02/1961 DOA: 07/16/2017 PCP: Patient, No Pcp Per    Brief Narrative:  56yo M w/ a hx of alcohol abuse who presented with generalized weakness, abdominal distention. Admitted for massive ascites secondary to alcoholic cirrhosis. He was seen by gastroenterology in consultation. He was subsequently diagnosed with spontaneous bacterial peritonitis and treated with a full course of antibiotics and is now on suppressive therapy. Hospitalization has been complicated and prolonged by acute kidney injury which is slowly progressive and of unclear etiology. Further complicating matters is rapidly recurring ascites requiring multiple paracenteses.   Subjective: Patient denies any nausea vomiting abdominal pain, tolerating diet and does not feel bloated  Assessment & Plan:  Decompensated alcoholic cirrhosis with associated thrombocytopenia and massive volume overload - MELD 29 - stopped drinking alcohol four months prior to this admit  - s/p large volume paracentesis 8/27, 8/29, 8/31, 9/4, 9/10  - Palliative recommending an abdom pleurex catheter w/ Gen Surgery making plans for placement but probably not able to place on 9/13 as per conversation this morning with Dr. 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 - not a candidate for HD - appears crt has peaked - cont to monitor trend , slowly improving  Recent Labs Lab 07/29/17 0819 07/30/17 0659 07/31/17 0602 08/01/17 0422 08/02/17 0635  CREATININE 4.07* 4.06* 3.85* 3.60* 3.45*     SBP - Cultures no growth received a full course of antibiotics - now on suppressive therapy w/ cipro   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  DM2 CBG is presently well controlled  Alcohol abuse in remission  Persistent small bilateral pleural effusions Not clinically significant at the present time - 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  Severe malnutrition B12 and folate levels adequate  Anemia of chronic disease Will follow CBC, currently 8.0, transfuse for hemoglobin less than 7.0    DVT prophylaxis: SCDs Code Status: DNR - NO CODE Family Communication: no family present at time of exam  Disposition Plan: For Pleurx catheter placement, anticipate discharge home with hospice  Consultants:  GI Gen Surgery  Nephrology   Procedures:  Large volume paracentesis 8/27  Large volume paracentesis 8/29  Large volume paracentesis 8/31  Large-volume paracentesis 9/4  Antimicrobials:  Ceftriaxone 8/26 > 9/2 Cipro 9/6 >  Objective: Blood pressure 109/62, pulse (!) 101, temperature 97.7 F (36.5 C), temperature source Oral, resp. rate 20, height 5\' 9"  (1.753 m), weight 86.9 kg (191 lb 9.3 oz), SpO2 100 %.  Intake/Output Summary (Last 24 hours) at 08/02/17 1248 Last data filed at 08/02/17 0900  Gross per 24 hour  Intake              960 ml  Output                0 ml  Net              960 ml   Filed Weights   07/16/17 2112 07/20/17 0456 07/26/17 0629  Weight: 98.2 kg (216 lb 8 oz) 86.9 kg (191 lb 9.6 oz) 86.9 kg (191 lb 9.3 oz)    Examination: General: No acute respiratory distress - alert  Lungs: CTA B - no  wheeze  Cardiovascular: Regular rate and rhythm  Abdomen: Nontender, distended,Fluid shift, soft, bowel sounds positive, no rebound - obvious ascites but not tense  Extremities: 2+ edema B LE   CBC:  Recent Labs Lab 07/30/17 0659 07/31/17 0602 08/02/17 0635  WBC 8.2 7.8 7.0  HGB 8.8* 8.3* 8.0*  HCT 25.1* 24.1* 23.2*  MCV 98.4 98.8 98.3  PLT 98* 104* 84*   Basic Metabolic Panel:  Recent Labs Lab 07/28/17 0406 07/29/17 0819 07/30/17 0659 07/31/17 0602 08/01/17 0422  08/02/17 0635  NA 135  134* 135 136 132* 133* 131*  K 5.5*  5.5* 4.7 4.5 4.3 3.9 3.4*  CL 103  103 105 103 100* 101 100*  CO2 21*  21* 21* 22 21* 21* 22  GLUCOSE 109*  110* 119* 138* 133* 103* 169*  BUN 89*  91* 93* 95* 94* 90* 93*  CREATININE 4.14*  4.16* 4.07* 4.06* 3.85* 3.60* 3.45*  CALCIUM 9.3  9.1 9.0 9.0 8.7* 8.6* 8.4*  PHOS 5.8*  --   --   --   --   --    GFR: Estimated Creatinine Clearance: 26.1 mL/min (A) (by C-G formula based on SCr of 3.45 mg/dL (H)).  Liver Function Tests:  Recent Labs Lab 07/27/17 0502 07/28/17 0406 07/29/17 0819 07/30/17 0659 07/31/17 0602 08/01/17 0422  AST --  33 28  ALT 9* 10* 10*  --  14* 14*  ALKPHOS 34* 37* 40  --  49 43  BILITOT 1.9* 1.8* 1.7* 1.3* 1.4* 1.8*  PROT 5.5* 6.0* 5.8*  --  5.9* 5.8*  ALBUMIN 2.8* 2.9*  2.9* 2.9*  --  2.6* 2.9*    Recent Labs Lab 07/27/17 0502 07/31/17 0602  AMMONIA 50* 54*    Coagulation Profile:  Recent Labs Lab 07/30/17 0659  INR 1.99    HbA1C: Hgb A1c MFr Bld  Date/Time Value Ref Range Status  07/17/2017 06:23 AM 4.5 (L) 4.8 - 5.6 % Final    Comment:    (NOTE) Pre diabetes:          5.7%-6.4% Diabetes:              >6.4% Glycemic control for   <7.0% adults with diabetes     CBG:  Recent Labs Lab 08/01/17 1128 08/01/17 1659 08/01/17 2122 08/02/17 0735 08/02/17 1153  GLUCAP 174* 128* 127* 155* 144*    Scheduled Meds: . ciprofloxacin  500 mg Oral Q24H  . feeding supplement (ENSURE ENLIVE)  237 mL Oral TID BM  . insulin aspart  0-9 Units Subcutaneous TID WC  . lactulose  10 g Oral TID  . polyethylene glycol  17 g Oral BID  . torsemide  100 mg Oral Daily  . traZODone  50 mg Oral QHS    LOS: 17 days      On-Call/Text Page:      [email or web address]      password [email or web address]  If 7PM-7AM, please contact night-coverage [email or web address] Password [email or web address] 08/02/2017, 12:48 PM

## 2017-08-03 ENCOUNTER — Inpatient Hospital Stay (HOSPITAL_COMMUNITY): Payer: No Typology Code available for payment source

## 2017-08-03 DIAGNOSIS — R140 Abdominal distension (gaseous): Secondary | ICD-10-CM

## 2017-08-03 LAB — GLUCOSE, CAPILLARY
GLUCOSE-CAPILLARY: 118 mg/dL — AB (ref 65–99)
GLUCOSE-CAPILLARY: 153 mg/dL — AB (ref 65–99)
GLUCOSE-CAPILLARY: 107 mg/dL — AB (ref 65–99)
Glucose-Capillary: 129 mg/dL — ABNORMAL HIGH (ref 65–99)

## 2017-08-03 LAB — PROTIME-INR
INR: 1.91
Prothrombin Time: 21.7 seconds — ABNORMAL HIGH (ref 11.4–15.2)

## 2017-08-03 LAB — APTT: aPTT: 46 seconds — ABNORMAL HIGH (ref 24–36)

## 2017-08-03 MED ORDER — POLYETHYLENE GLYCOL 3350 17 G PO PACK: 17.0000 g | PACK | Freq: Two times a day (BID) | ORAL | 0 refills | Status: AC

## 2017-08-03 MED ORDER — LACTULOSE 10 GM/15ML PO SOLN
10.0000 g | Freq: Three times a day (TID) | ORAL | 2 refills | Status: AC
Start: 2017-08-03 — End: ?

## 2017-08-03 MED ORDER — VITAMIN K1 10 MG/ML IJ SOLN
10.0000 mg | Freq: Three times a day (TID) | INTRAMUSCULAR | Status: AC
  Administered 2017-08-03 – 2017-08-04 (×2): 10 mg via SUBCUTANEOUS
  Filled 2017-08-03 (×2): qty 1

## 2017-08-03 MED ORDER — SIMETHICONE 80 MG PO CHEW: 80.0000 mg | CHEWABLE_TABLET | Freq: Four times a day (QID) | ORAL | 0 refills | Status: AC | PRN

## 2017-08-03 MED ORDER — ALBUMIN HUMAN 25 % IV SOLN
50.0000 g | Freq: Once | INTRAVENOUS | Status: DC
  Filled 2017-08-03: qty 200

## 2017-08-03 MED ORDER — POTASSIUM CHLORIDE 20 MEQ PO PACK: 40.0000 meq | PACK | Freq: Once | ORAL | Status: DC

## 2017-08-03 MED ORDER — TORSEMIDE 100 MG PO TABS
100.0000 mg | ORAL_TABLET | Freq: Every day | ORAL | 1 refills | Status: AC
Start: 2017-08-04 — End: ?

## 2017-08-03 MED ORDER — CIPROFLOXACIN HCL 500 MG PO TABS: 500.0000 mg | ORAL_TABLET | ORAL | 1 refills | Status: AC

## 2017-08-03 MED ORDER — POTASSIUM CHLORIDE ER 10 MEQ PO TBCR: 20.0000 meq | EXTENDED_RELEASE_TABLET | Freq: Every day | ORAL | 1 refills | Status: AC

## 2017-08-03 MED ORDER — VITAMIN K1 10 MG/ML IJ SOLN: 10.0000 mg | Freq: Three times a day (TID) | INTRAMUSCULAR | Status: DC

## 2017-08-03 NOTE — Progress Notes (Signed)
Subjective: Interval History: Patient feels much better except weakness. Still he doesn't have any nausea or vomiting but appetite is poor.  Objective: Vital signs in last 24 hours: Temp:  [98.1 F (36.7 C)-98.3 F (36.8 C)] 98.3 F (36.8 C) (09/13 0525) Pulse Rate:  [90-104] 90 (09/13 0525) Resp:  [20] 20 (09/13 0525) BP: (93-99)/(57-61) 99/58 (09/13 0525) SpO2:  [97 %-100 %] 100 % (09/13 0525) Weight change:   Intake/Output from previous day: 09/12 0701 - 09/13 0700 In: 240 [P.O.:240] Out: 100 [Urine:100] Intake/Output this shift: Total I/O In: 360 [P.O.:360] Out: -   General appearance: alert, cooperative and no distress Resp: diminished breath sounds bilaterally Cardio: regular rate and rhythm GI: Distended, nontender Extremities: edema 2+ edema bilaterally  Lab Results:  Recent Labs  08/02/17 0635  WBC 7.0  HGB 8.0*  HCT 23.2*  PLT 84*   BMET:   Recent Labs  08/01/17 0422 08/02/17 0635  NA 133* 131*  K 3.9 3.4*  CL 101 100*  CO2 21* 22  GLUCOSE 103* 169*  BUN 90* 93*  CREATININE 3.60* 3.45*  CALCIUM 8.6* 8.4*   No results for input(s): PTH in the last 72 hours. Iron Studies:  No results for input(s): IRON, TIBC, TRANSFERRIN, FERRITIN in the last 72 hours.  Studies/Results: No results found.  I have reviewed the patient's current medications.  Assessment/Plan: Problem #1 renal failure: Most likely chronic . Etiology not clear but hypertension/ ATN/paraprothenmia .his creatinine is 3.45 and his renal function very slowly improving. No significant change with his appetite. Problem #2 history of liver cirrhosis: Patient with anasarca requiring repeated paracentesis. Patient for possible Pleurex placement Problem #3 anemia: His hemoglobin is below target goal and declining.. Problem #4 hypertension: His blood pressure is reasonably controlled Problem #5 history of diabetes: His blood sugar is reasonably controlled Problem #6 bone and mineral  disorder: His calcium  is in range but his phosphorus is 5.8 which is higher than our target goal. Presently he is not in a binder. 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 Plan: 1] will continue his present management. Patient overall was poor prognosis  2]Renal panel in am    LOS: 18 days   Yulisa Chirico S 08/03/2017,9:26 AM

## 2017-08-03 NOTE — Progress Notes (Signed)
Physical Therapy Treatment Patient Details Name: Shawn JourdainDavid L [name] MRN: [id number] DOB: 02/17/1961 Today's Date: 08/03/2017    History of Present Illness Shawn LaymanDavid [name]  is a 56 y.o. male, With history of alcohol abuse, hypertension who came to hospital with complaints of generalized weakness and abdominal distention for past 3 months. Patient says that he quit drinking 4 months ago but he has been drinking almost all his life. Patient says he does not visit doctors and does not take any medications. He does have diabetes but has been taking care of diabetes by diet alone. He denies nausea vomiting or diarrhea. No fever. No chills. He complains of shortness of breath. No chest pain.    PT Comments    Pt reports increased motivation following discussion with social worker and discharge tomorrow.  Pt independent with bed mobility today with increased time to complete.  Increased distance with gait training with safe mechanics noted.  EOS pt left sitting on EOB.  No reports of pain was slightly fatigued with increased distance.  Call bell within reach.     Follow Up Recommendations        Equipment Recommendations       Recommendations for Other Services       Precautions / Restrictions Precautions Precautions: Fall Precaution Comments: minor  Restrictions Weight Bearing Restrictions: No    Mobility  Bed Mobility Overal bed mobility: Independent             General bed mobility comments: independent bed mobility, increased time to complete  Transfers Overall transfer level: Modified independent Equipment used: Rolling walker (2 wheeled) Transfers: Sit to/from Stand (elevated bed height) Sit to Stand: Supervision         General transfer comment: proper hand placement noted with sit to stand  Ambulation/Gait Ambulation/Gait assistance: Supervision Ambulation Distance (Feet): 300 Feet Assistive device: Rolling walker (2 wheeled) Gait Pattern/deviations: Decreased step  length - left;Decreased stance time - right;Decreased stride length     General Gait Details: increased tolerance for gait training   Stairs            Wheelchair Mobility    Modified Rankin (Stroke Patients Only)       Balance                                            Cognition Arousal/Alertness: Awake/alert Behavior During Therapy: WFL for tasks assessed/performed Overall Cognitive Status: Within Functional Limits for tasks assessed                                        Exercises      General Comments        Pertinent Vitals/Pain Pain Assessment: No/denies pain    Home Living                      Prior Function            PT Goals (current goals can now be found in the care plan section) Progress towards PT goals: Progressing toward goals    Frequency           PT Plan Current plan remains appropriate    Co-evaluation              AM-PAC PT "6  Clicks" Daily Activity  Outcome Measure  Difficulty turning over in bed (including adjusting bedclothes, sheets and blankets)?: None Difficulty moving from lying on back to sitting on the side of the bed? : None Difficulty sitting down on and standing up from a chair with arms (e.g., wheelchair, bedside commode, etc,.)?: None Help needed moving to and from a bed to chair (including a wheelchair)?: A Little Help needed walking in hospital room?: A Little Help needed climbing 3-5 steps with a railing? : A Little 6 Click Score: 21    End of Session Equipment Utilized During Treatment: Gait belt Activity Tolerance: Patient tolerated treatment well;No increased pain Patient left: in bed;with chair alarm set (sitting on EOB) Nurse Communication: Mobility status       Time: 1337-1400 PT Time Calculation (min) (ACUTE ONLY): 23 min  Charges:  $Gait Training: 8-22 mins                    G Codes:       Becky Sax, LPTA;  CBIS [phone]  Juel Burrow 08/03/2017, 2:22 PM

## 2017-08-03 NOTE — Consult Note (Signed)
Chief Complaint: Patient was seen in consultation today for tunneled peritoneal drain catheter placement Chief Complaint  Patient presents with  . abdominal distension  . Weakness   at the request of Dr Derrek Gu   Referring Physician(s): Roseanne Kaufman [facility] Quinn Axe NP- Palliative  Supervising Physician: Corrie Mckusick  Patient Status: [facility]- Inpt  History of Present Illness: Shawn [name] is a 56 y.o. male   Alcoholic cirrhosis Massive ascites Multiple paracentesis-- all no growth Rapid re accumulation MELD 30 today  Spontaneous bacterial peritonitis few weeks ago - now suppressive Bactrim Hepatorenal syndrome End stage cirrhosis Palliative consult Recommendation of tunneled peritoneal drain catheter with Home Hospice Request made for IR placement  I have discussed with Dr Earleen Newport He has reviewed imaging and chart-- approved procedure   Past Medical History:  Diagnosis Date  . Alcohol abuse   . Diabetes mellitus without complication ([place])   . Gout   . Hypertension   . 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 none      Allergies: Patient has no known allergies.  Medications: Prior to Admission medications   Medication Sig Start Date End Date Taking? Authorizing Provider  Multiple Vitamins-Minerals (MULTIVITAMIN ADULTS 50+) TABS Take 1 tablet by mouth daily.   Yes [provider]  ciprofloxacin (CIPRO) 500 MG tablet Take 1 tablet (500 mg total) by mouth daily. 08/04/17   Reyne Dumas, MD  lactulose (CHRONULAC) 10 GM/15ML solution Take 15 mLs (10 g total) by mouth 3 (three) times daily. 08/03/17   Reyne Dumas, MD  polyethylene glycol (MIRALAX / GLYCOLAX) packet Take 17 g by mouth 2 (two) times daily. 08/03/17   Reyne Dumas, MD  potassium chloride (K-DUR) 10 MEQ tablet Take 2 tablets (20 mEq total) by mouth daily. 08/03/17   Reyne Dumas, MD  simethicone (MYLICON) 80 MG chewable tablet Chew 1 tablet  (80 mg total) by mouth 4 (four) times daily as needed for flatulence. 08/03/17   Reyne Dumas, MD  torsemide (DEMADEX) 100 MG tablet Take 1 tablet (100 mg total) by mouth daily. 08/04/17   Reyne Dumas, MD     Family History  Problem Relation Age of Onset  . Colon cancer Neg Hx   . Colon polyps Neg Hx     Social History   Social History  . Marital status: Single    Spouse name: N/A  . Number of children: N/A  . Years of education: N/A   Social History Main Topics  . Smoking status: Never Smoker  . Smokeless tobacco: Current User  . Alcohol use Yes     Comment: stopped drinking 3 months ago, but normally 6-10 beers a day.   . Drug use: No  . Sexual activity: Not Asked   Other Topics Concern  . None   Social History Narrative  . None    Review of Systems: A 12 point ROS discussed and pertinent positives are indicated in the HPI above.  All other systems are negative.  Review of Systems  Constitutional: Positive for activity change, appetite change, fatigue and unexpected weight change. Negative for fever.  Respiratory: Positive for shortness of breath. Negative for cough.   Cardiovascular: Negative for chest pain.  Gastrointestinal: Positive for abdominal distention and abdominal pain.  Neurological: Positive for weakness.  Psychiatric/Behavioral: Negative for behavioral problems and confusion.    Vital Signs: BP (!) 99/58 (BP Location: Left Arm)   Pulse 90   Temp 98.3 F (  36.8 C) (Oral)   Resp 20   Ht _0  (1.753 m)   Wt 191 lb 9.3 oz (86.9 kg)   SpO2 100%   BMI 28.29 kg/m   Physical Exam  Constitutional: He is oriented to person, place, and time.  Thin/ frail  Cardiovascular: Normal rate and regular rhythm.   Pulmonary/Chest: Effort normal and breath sounds normal.  Abdominal: Bowel sounds are normal. He exhibits distension. There is no tenderness.  Musculoskeletal: Normal range of motion.  Neurological: He is alert and oriented to person, place, and  time.  Skin: Skin is warm and dry.  Psychiatric: He has a normal mood and affect. His behavior is normal. Judgment and thought content normal.  Nursing note and vitals reviewed.   Imaging: Ct Abdomen Pelvis Wo Contrast  Result Date: 07/16/2017 CLINICAL DATA:  Abdominal distention, weakness.  Heavy drinker. EXAM: CT ABDOMEN AND PELVIS WITHOUT CONTRAST TECHNIQUE: Multidetector CT imaging of the abdomen and pelvis was performed following the standard protocol without IV contrast. COMPARISON:  None. FINDINGS: Lower chest: Moderate left and small right pleural effusion. Patchy right lower lobe opacity, likely atelectasis, pneumonia not excluded. Patchy left lower lobe opacity, likely atelectasis. Hepatobiliary: Shrunken, nodular liver, reflecting a cirrhotic configuration. No focal hepatic lesion is seen. Gallbladder is contracted with multiple small gallstones (series 2/image 28). Pancreas: Within normal limits. Spleen: Normal in size. Adrenals/Urinary Tract: Adrenal glands are within normal limits. Kidneys are within normal limits.  No renal or ureteral calculi. Bladder is not discretely visualized. Stomach/Bowel: Stomach is within normal limits. Visualized bowel is grossly unremarkable. No evidence of bowel obstruction. Vascular/Lymphatic: No evidence of abdominal aortic aneurysm. Mild atherosclerotic calcifications. No gross abdominopelvic lymphadenopathy, although poorly evaluated. Reproductive: Prostate is unremarkable. Other: Large volume abdominopelvic ascites. Musculoskeletal: Mild degenerative changes of the visualized thoracolumbar spine, most prominent at L4-5. IMPRESSION: Cirrhosis.  No focal hepatic lesion is seen. Large volume abdominopelvic ascites. Moderate left and small right pleural effusions. Patchy bilateral lower lobe opacities, likely atelectasis, right lower lobe pneumonia not excluded. Electronically Signed   By: Julian Hy M.D.   On: 07/16/2017 18:17   Dg Chest 2  View  Result Date: 07/27/2017 CLINICAL DATA:  Onset of shortness of breath today. History of hypertension, cirrhosis and ascites, acute renal injury EXAM: CHEST  2 VIEW COMPARISON:  Chest x-ray of July 26, 2017 FINDINGS: The lungs are reasonably well inflated. Small bilateral pleural effusions persist. There is no alveolar infiltrate or pleural effusion. The heart and pulmonary vascularity are normal. The mediastinum is normal in width. The bony thorax exhibits no acute abnormality. IMPRESSION: Persistent small bilateral pleural effusions. Otherwise no active cardiopulmonary disease. Electronically Signed   By: Abu  Martinique M.D.   On: 07/27/2017 16:01   Dg Chest 2 View  Result Date: 07/26/2017 CLINICAL DATA:  Lisette Grinder, paracentesis a day ago also 2 days ago EXAM: CHEST - 2 VIEW COMPARISON:  07/16/2017 FINDINGS: Improved aeration in the lung bases. Small residual pleural effusions. Heart size and mediastinal contours are within normal limits. No pneumothorax. Visualized bones unremarkable. IMPRESSION: Improved aeration.  Residual small pleural effusions. Electronically Signed   By: Lucrezia Europe M.D.   On: 07/26/2017 11:51   Dg Chest 2 View  Result Date: 07/16/2017 CLINICAL DATA:  Cough and weakness EXAM: CHEST  2 VIEW COMPARISON:  02/05/2015 FINDINGS: Cardiac shadow is stable. The overall inspiratory effort is poor with bibasilar atelectatic changes. Left pleural effusion is noted similar to that seen on recent chest CT. No other  focal abnormality is noted. IMPRESSION: Bibasilar atelectasis and left-sided pleural effusion. Electronically Signed   By: Inez Catalina M.D.   On: 07/16/2017 18:15   US Paracentesis  Result Date: 07/31/2017 INDICATION: Alcoholic cirrhosis, recurrent ascites EXAM: ULTRASOUND GUIDED DIAGNOSTIC PARACENTESIS MEDICATIONS: None. COMPLICATIONS: None immediate. PROCEDURE: Procedure, benefits, and risks of procedure were discussed with patient. Written informed consent for  procedure was obtained. Time out protocol followed. Adequate collection of ascites localized by ultrasound in RIGHT lower quadrant. Skin prepped and draped in usual sterile fashion. Skin and soft tissues anesthetized with 10 mL of 1% lidocaine. 5 [place] Yueh catheter placed into peritoneal cavity. 2.2 L of ascitic fluid aspirated by vacuum bottle suction. Procedure tolerated well by patient without immediate complication. FINDINGS: As above IMPRESSION: Successful ultrasound-guided paracentesis yielding 2.2 liters of peritoneal fluid. Electronically Signed   By: Lavonia Dana M.D.   On: 07/31/2017 13:25   US Paracentesis  Result Date: 07/25/2017 INDICATION: Alcoholic cirrhosis, ascites EXAM: ULTRASOUND GUIDED THERAPEUTIC PARACENTESIS MEDICATIONS: None. COMPLICATIONS: None immediate. PROCEDURE: Procedure, benefits, and risks of procedure were discussed with patient. Written informed consent for procedure was obtained. Time out protocol followed. Adequate collection of ascites localized by ultrasound in LEFT lower quadrant. Skin prepped and draped in usual sterile fashion. Skin and soft tissues anesthetized with 10 mL of 1% lidocaine. 5 [place] Yueh catheter placed into peritoneal cavity. 6.1 L of yellow ascitic fluid aspirated by vacuum bottle suction. Patient reported onset of mild upper abdominal discomfort, the same feeling at associated with the previous paracentesis when he developed postprocedural hypotension ; at the patient's request, the procedure was terminated at this point. Procedure tolerated well by patient without immediate complication. FINDINGS: As above IMPRESSION: Successful ultrasound-guided paracentesis yielding 6.1 liters of peritoneal fluid. Electronically Signed   By: Lavonia Dana M.D.   On: 07/25/2017 15:58   US Paracentesis  Result Date: 07/21/2017 INDICATION: Alcoholic cirrhosis, ascites EXAM: ULTRASOUND GUIDED  PARACENTESIS MEDICATIONS: None. COMPLICATIONS: None immediate. PROCEDURE:  Procedure, benefits, and risks of procedure were discussed with patient. Written informed consent for procedure was obtained. Time out protocol followed. Adequate collection of ascites localized by ultrasound in RIGHT lower quadrant. Skin prepped and draped in usual sterile fashion. Skin and soft tissues anesthetized with 10 mL of 1% lidocaine. 5 [place] Yueh catheter placed into peritoneal cavity. 13.0 of clear yellow fluid aspirated by vacuum bottle suction. Procedure tolerated well by patient without immediate complication. FINDINGS: As above IMPRESSION: Successful ultrasound-guided paracentesis yielding 13 liters of peritoneal fluid. Electronically Signed   By: Lavonia Dana M.D.   On: 07/21/2017 14:55   US Paracentesis  Result Date: 07/19/2017 INDICATION: Symptomatic ascites with shortness of breath. Rapid reaccumulation after paracentesis yesterday. Request for additional drainage. Patient received IV albumin prior to the procedure. EXAM: ULTRASOUND GUIDED THERAPEUTIC PARACENTESIS MEDICATIONS: None. COMPLICATIONS: None immediate. PROCEDURE: Informed written consent was obtained from the patient after a discussion of the risks, benefits and alternatives to treatment. Patient was informed that he was at increased risk of bleeding due to liver dysfunction, elevated INR, and depressed platelets. He accepts this level of risk due to degree of symptoms. A timeout was performed prior to the initiation of the procedure. Initial ultrasound scanning demonstrates a massive amount of ascites within the left lower abdominal quadrant, with tense abdomen and shallow breathing. The left lower abdomen was prepped and draped in the usual sterile fashion. 1% lidocaine was used for local anesthesia. Following this, a 19 gauge, 7-cm, Yueh catheter was introduced. An  ultrasound image was saved for documentation purposes. The paracentesis was performed. Due to patient's bleeding risk for repeat punctures a larger volume of drainage  than before was needed to allow time for medical stabilization of the patient's ascites. Although there was additional ascites available for withdrawal and the patient's blood pressure was stable, no additional drainage performed to ensure no adverse renal effects. The catheter was removed and a dressing was applied. The patient tolerated the procedure well without immediate post procedural complication. FINDINGS: A total of approximately 10 L of yellow ascitic fluid was removed. IMPRESSION: Successful ultrasound-guided paracentesis yielding 10 liters of peritoneal fluid. Electronically Signed   By: Monte Fantasia M.D.   On: 07/19/2017 17:36   US Paracentesis  Result Date: 07/17/2017 INDICATION: Ascites. EXAM: ULTRASOUND GUIDED RIGHT PARACENTESIS MEDICATIONS: None. COMPLICATIONS: None immediate. PROCEDURE: Informed written consent was obtained from the patient after a discussion of the risks, benefits and alternatives to treatment. A timeout was performed prior to the initiation of the procedure. Initial ultrasound scanning demonstrates a large amount of ascites within the right lower abdominal quadrant. The right lower abdomen was prepped and draped in the usual sterile fashion. 1% lidocaine with epinephrine was used for local anesthesia. Following this, a Yuen catheter was introduced. An ultrasound image was saved for documentation purposes. The paracentesis was performed. The catheter was removed and a dressing was applied. The patient tolerated the procedure well without immediate post procedural complication. FINDINGS: A total of approximately 4.2 L of straw-colored fluid was removed. Samples were sent to the laboratory as requested by the clinical team. IMPRESSION: Successful ultrasound-guided paracentesis yielding 4.2 liters of peritoneal fluid. Electronically Signed   By: Lorriane Shire M.D.   On: 07/17/2017 10:05   Dg Bone Survey Met  Result Date: 07/22/2017 CLINICAL DATA:  56 year old with a  polyclonal gammopathy and generalized bone pain. Current history of alcoholic cirrhosis an acute renal insufficiency. EXAM: METASTATIC BONE SURVEY COMPARISON:  Chest x-ray 07/16/2017. Bone window images from CT abdomen and pelvis 07/16/2017. Left foot x-rays 09/04/2004. FINDINGS: The following bones were imaged: Lateral skull:  No lytic lesions. AP views of both shoulders: No lytic lesions. Mild degenerative changes involving both acromioclavicular joints. AP views of both humeri:  No lytic lesions or endosteal scalloping. AP views of both forearms: No lytic lesions or endosteal scalloping. Cervical spine, AP and cross-table lateral views: Straightening of the usual cervical lordosis. Visualization only through the upper endplate of C6 on the lateral view. No lytic lesions. Thoracic spine, AP and cross-table lateral views: Cross-table lateral image is underexposed, though the AP image is diagnostic. No lytic lesions. Mild degenerative disc disease and spondylosis involving the mid and lower thoracic spine. Lumbar spine, AP and cross-table lateral views: 5 non-rib-bearing lumbar vertebrae with anatomic alignment. No lytic lesions. Severe disc space narrowing at L5-S1. Moderate disc space narrowing at L4-5. PA chest: No visible lytic lesions involving the ribs. Cardiac silhouette mildly enlarged, unchanged. Elevation of the right hemidiaphragm to shown on the recent CT to be secondary to a large amount of ascites. Small left pleural effusion with associated passive atelectasis in the left lower lobe, unchanged. Pulmonary venous hypertension without overt edema. AP pelvis: No lytic lesions. Well-preserved joint spaces in both hips. Sacroiliac joints and symphysis pubis intact. AP views of both femurs:  No lytic lesions or endosteal scalloping. Lateral views of both tibia-fibula: No lytic lesions or endosteal scalloping. IMPRESSION: No evidence of multiple myeloma or other generalized bone disorder. Electronically  Signed  By: Evangeline Dakin M.D.   On: 07/22/2017 15:42    Labs:  CBC:  Recent Labs  07/25/17 0610 07/30/17 0659 07/31/17 0602 08/02/17 0635  WBC 8.9 8.2 7.8 7.0  HGB 9.0* 8.8* 8.3* 8.0*  HCT 25.4* 25.1* 24.1* 23.2*  PLT 70* 98* 104* 84*    COAGS:  Recent Labs  07/18/17 1233 07/22/17 0538 07/26/17 0904 07/30/17 0659  INR 1.99 2.46 2.15 1.99    BMP:  Recent Labs  07/30/17 0659 07/31/17 0602 08/01/17 0422 08/02/17 0635  NA 136 132* 133* 131*  K 4.5 4.3 3.9 3.4*  CL 103 100* 101 100*  CO2 22 21* 21* 22  GLUCOSE 138* 133* 103* 169*  BUN 95* 94* 90* 93*  CALCIUM 9.0 8.7* 8.6* 8.4*  CREATININE 4.06* 3.85* 3.60* 3.45*  GFRNONAA 15* 16* 17* 18*  GFRAA 18* 19* 20* 21*    LIVER FUNCTION TESTS:  Recent Labs  07/28/17 0406 07/29/17 0819 07/30/17 0659 07/31/17 0602 08/01/17 0422  BILITOT 1.8* 1.7* 1.3* 1.4* 1.8*  AST 22 22  --  33 28  ALT 10* 10*  --  14* 14*  ALKPHOS 37* 40  --  49 43  PROT 6.0* 5.8*  --  5.9* 5.8*  ALBUMIN 2.9*  2.9* 2.9*  --  2.6* 2.9*    TUMOR MARKERS: No results for input(s): AFPTM, CEA, CA199, CHROMGRNA in the last 8760 hours.  Assessment and Plan:  Etoh cirrhosis- end stage Recurrent massive ascites Multiple paracentesis Hepatorenal syndrome For Home Hospice Scheduled now for tunneled peritoneal catheter placement Pt to be at [facility] 9/14 at 9 am via ambulance To return to [facility] after procedure Risks and benefits discussed with the patient including bleeding, infection, damage to adjacent structures, and sepsis. All of the patient's questions were answered, patient is agreeable to proceed. Consent signed and in chart.  Thank you for this interesting consult.  I greatly enjoyed meeting VITALIY EISENHOUR and look forward to participating in their care.  A copy of this report was sent to the requesting provider on this date.  Electronically Signed: Lavonia Drafts, PA-C 08/03/2017, 12:57 PM   I spent a total of 40 Minutes     in face to face in clinical consultation, greater than 50% of which was counseling/coordinating care for tunneled peritoneal drain catheter placement

## 2017-08-03 NOTE — Care Management Note (Signed)
Case Management Note  Patient Details  Name: Ardeen JourdainDavid L Fandrich MRN: [id number] Date of Birth: 06/20/1961  If discussed at Long Length of Stay Meetings, dates discussed:  08/03/2017  Additional Comments: Plan is now to have Pleurex placed tomorrow at [facility]. (needs to stay overnight to have drain placed as inp as OP placement will take much longer to get scheduled.) Pt will DC home afterwards. Pt will call [facility] and they will deliver DME to pt's home after he arrives. [facility] will admit on Saturday. Pt has made arrangements for transportation tomorrow afternoon.   Malcolm Metrohildress, Sondos Wolfman Demske, RN 08/03/2017, 2:05 PM

## 2017-08-03 NOTE — Progress Notes (Signed)
TEAM 2  CALUM CORMIER  ZOX:[id number] DOB: December 06, 1960 DOA: 07/16/2017 PCP: Patient, No Pcp Per    Brief Narrative:  56yo M w/ a hx of alcohol abuse who presented with generalized weakness, abdominal distention. Admitted for massive ascites secondary to alcoholic cirrhosis. He was seen by gastroenterology in consultation. He was subsequently diagnosed with spontaneous bacterial peritonitis and treated with a full course of antibiotics and is now on suppressive therapy. Hospitalization has been complicated and prolonged by acute kidney injury which is slowly progressive and of unclear etiology. Further complicating matters is rapidly recurring ascites requiring multiple paracenteses.   Subjective: Continues to have generalized weakness and poor appetite, abdomen slightly more distended  Assessment & Plan:  Decompensated alcoholic cirrhosis with associated thrombocytopenia and massive volume overload - MELD 29 - stopped drinking alcohol four months prior to this admit  - s/p large volume paracentesis 8/27, 8/29, 8/31, 9/4, 9/10, now scheduled for another paracentesis 9/13  - Palliative recommending an abdom pleurex catheter w/ Gen Surgery   not able to place on 9/13 as per conversation Contacted surgery in [place] to see if they are able to place it, Dr. Luisa Hart , deferred it to Dr. Lovell Sheehan  Interventional radiology contacted for placement, they will try to place It tomorrow     Constipation / ?impaction  Appears resolved    Acute kidney injury 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 - not a candidate for HD - appears crt has peaked - cont to monitor trend , slowly improving  Recent Labs Lab 07/29/17 0819 07/30/17 0659 07/31/17 0602 08/01/17 0422 08/02/17 0635  CREATININE 4.07* 4.06* 3.85* 3.60* 3.45*     SBP - Cultures no growth received a full course of antibiotics - now on suppressive therapy w/ cipro   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  DM2 CBG is presently well controlled  Alcohol abuse in remission  Persistent small bilateral pleural effusions Not clinically significant at the present time - 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  Severe malnutrition B12 and folate levels adequate  Anemia of chronic disease Will follow CBC, currently 8.0, transfuse for hemoglobin less than 7.0    DVT prophylaxis: SCDs Code Status: DNR - NO CODE Family Communication: no family present at time of exam  Disposition Plan: For Pleurx catheter placement, anticipate discharge home with hospice  Consultants:  GI Gen Surgery  Nephrology   Procedures:  Large volume paracentesis 8/27  Large volume paracentesis 8/29  Large volume paracentesis 8/31  Large-volume paracentesis 9/4  Antimicrobials:  Ceftriaxone 8/26 > 9/2 Cipro 9/6 >  Objective: Blood pressure (!) 99/58, pulse 90, temperature 98.3 F (36.8 C), temperature source Oral, resp. rate 20, height  (1.753 m), weight 86.9 kg (191 lb 9.3 oz), SpO2 100 %.  Intake/Output Summary (Last 24 hours) at 08/03/17 1155 Last data filed at 08/03/17 0858  Gross per 24 hour  Intake              360 ml  Output              100 ml  Net              260 ml   Filed Weights   07/16/17 2112 07/20/17 0456 07/26/17 0629  Weight: 98.2 kg (216 lb 8 oz) 86.9 kg (191 lb 9.6 oz) 86.9 kg (191 lb 9.3 oz)    Examination: General: No acute respiratory distress - alert  Lungs: CTA B - no wheeze  Cardiovascular: Regular rate and rhythm  Abdomen: Nontender, distended,Fluid shift, soft, bowel sounds positive, no rebound - obvious ascites but not tense  Extremities: 2+ edema B LE   CBC:  Recent Labs Lab 07/30/17 0659 07/31/17 0602 08/02/17 0635  WBC 8.2 7.8 7.0  HGB 8.8* 8.3* 8.0*  HCT 25.1* 24.1* 23.2*  MCV 98.4 98.8 98.3  PLT 98* 104* 84*   Basic Metabolic Panel:  Recent Labs Lab 07/28/17 0406 07/29/17 0819 07/30/17 0659 07/31/17 0602 08/01/17 0422  08/02/17 0635  NA 135  134* 135 136 132* 133* 131*  K 5.5*  5.5* 4.7 4.5 4.3 3.9 3.4*  CL 103  103 105 103 100* 101 100*  CO2 21*  21* 21* 22 21* 21* 22  GLUCOSE 109*  110* 119* 138* 133* 103* 169*  BUN 89*  91* 93* 95* 94* 90* 93*  CREATININE 4.14*  4.16* 4.07* 4.06* 3.85* 3.60* 3.45*  CALCIUM 9.3  9.1 9.0 9.0 8.7* 8.6* 8.4*  PHOS 5.8*  --   --   --   --   --    GFR: Estimated Creatinine Clearance: 26.1 mL/min (A) (by C-G formula based on SCr of 3.45 mg/dL (H)).  Liver Function Tests:  Recent Labs Lab 07/28/17 0406 07/29/17 0819 07/30/17 0659 07/31/17 0602 08/01/17 0422  AST 22 22  --  33 28  ALT 10* 10*  --  14* 14*  ALKPHOS 37* 40  --  49 43  BILITOT 1.8* 1.7* 1.3* 1.4* 1.8*  PROT 6.0* 5.8*  --  5.9* 5.8*  ALBUMIN 2.9*  2.9* 2.9*  --  2.6* 2.9*    Recent Labs Lab 07/31/17 0602  AMMONIA 54*    Coagulation Profile:  Recent Labs Lab 07/30/17 0659  INR 1.99    HbA1C: Hgb A1c MFr Bld  Date/Time Value Ref Range Status  07/17/2017 06:23 AM 4.5 (L) 4.8 - 5.6 % Final    Comment:    (NOTE) Pre diabetes:          5.7%-6.4% Diabetes:              >6.4% Glycemic control for   <7.0% adults with diabetes     CBG:  Recent Labs Lab 08/02/17 1153 08/02/17 1634 08/02/17 2049 08/03/17 0800 08/03/17 1137  GLUCAP 144* 181* 118* 118* 153*    Scheduled Meds: . ciprofloxacin  500 mg Oral Q24H  . feeding supplement (ENSURE ENLIVE)  237 mL Oral TID BM  . insulin aspart  0-9 Units Subcutaneous TID WC  . lactulose  10 g Oral TID  . polyethylene glycol  17 g Oral BID  . potassium chloride  40 mEq Oral Once  . torsemide  100 mg Oral Daily  . traZODone  50 mg Oral QHS    LOS: 18 days      On-Call/Text Page:      [email or web address]      password [email or web address]  If 7PM-7AM, please contact night-coverage [email or web address] Password [email or web address] 08/03/2017, 11:55 AM

## 2017-08-04 ENCOUNTER — Ambulatory Visit (HOSPITAL_COMMUNITY)
Admit: 2017-08-04 | Discharge: 2017-08-04 | Disposition: A | Discharge status: Home / Self Care | Payer: No Typology Code available for payment source | Attending: Internal Medicine | Admitting: Internal Medicine

## 2017-08-04 ENCOUNTER — Encounter (HOSPITAL_COMMUNITY): Payer: Self-pay | Admitting: Interventional Radiology

## 2017-08-04 DIAGNOSIS — K7031 Alcoholic cirrhosis of liver with ascites: Secondary | ICD-10-CM | POA: Insufficient documentation

## 2017-08-04 HISTORY — PX: IR GUIDED DRAIN W CATHETER PLACEMENT: IMG719

## 2017-08-04 LAB — GLUCOSE, CAPILLARY: Glucose-Capillary: 121 mg/dL — ABNORMAL HIGH (ref 65–99)

## 2017-08-04 LAB — COMPREHENSIVE METABOLIC PANEL
ALBUMIN: 2.5 g/dL — AB (ref 3.5–5.0)
ALT: 16 U/L — ABNORMAL LOW (ref 17–63)
ANION GAP: 8 (ref 5–15)
AST: 36 U/L (ref 15–41)
Alkaline Phosphatase: 65 U/L (ref 38–126)
BUN: 94 mg/dL — ABNORMAL HIGH (ref 6–20)
CO2: 22 mmol/L (ref 22–32)
Calcium: 8.3 mg/dL — ABNORMAL LOW (ref 8.9–10.3)
Chloride: 100 mmol/L — ABNORMAL LOW (ref 101–111)
Creatinine, Ser: 3.11 mg/dL — ABNORMAL HIGH (ref 0.61–1.24)
GFR calc Af Amer: 24 mL/min — ABNORMAL LOW (ref >60)
GFR calc non Af Amer: 21 mL/min — ABNORMAL LOW (ref >60)
GLUCOSE: 122 mg/dL — AB (ref 65–99)
POTASSIUM: 3.7 mmol/L (ref 3.5–5.1)
SODIUM: 130 mmol/L — AB (ref 135–145)
Total Bilirubin: 1.5 mg/dL — ABNORMAL HIGH (ref 0.3–1.2)
Total Protein: 5.9 g/dL — ABNORMAL LOW (ref 6.5–8.1)

## 2017-08-04 LAB — CBC
HCT: 22.8 % — ABNORMAL LOW (ref 39.0–52.0)
HEMOGLOBIN: 7.9 g/dL — AB (ref 13.0–17.0)
MCH: 34.1 pg — ABNORMAL HIGH (ref 26.0–34.0)
MCHC: 34.6 g/dL (ref 30.0–36.0)
MCV: 98.3 fL (ref 78.0–100.0)
Platelets: 87 10*3/uL — ABNORMAL LOW (ref 150–400)
RBC: 2.32 MIL/uL — ABNORMAL LOW (ref 4.22–5.81)
RDW: 18 % — AB (ref 11.5–15.5)
WBC: 8 10*3/uL (ref 4.0–10.5)

## 2017-08-04 MED ORDER — MIDAZOLAM HCL 2 MG/2ML IJ SOLN
INTRAMUSCULAR | Status: AC | PRN
  Administered 2017-08-04 (×2): 1 mg via INTRAVENOUS

## 2017-08-04 MED ORDER — FENTANYL CITRATE (PF) 100 MCG/2ML IJ SOLN
INTRAMUSCULAR | Status: AC
  Filled 2017-08-04: qty 4

## 2017-08-04 MED ORDER — FENTANYL CITRATE (PF) 100 MCG/2ML IJ SOLN
INTRAMUSCULAR | Status: AC | PRN
  Administered 2017-08-04 (×2): 25 ug via INTRAVENOUS

## 2017-08-04 MED ORDER — LIDOCAINE HCL (PF) 1 % IJ SOLN
INTRAMUSCULAR | Status: AC | PRN
  Administered 2017-08-04: 10 mL

## 2017-08-04 MED ORDER — MIDAZOLAM HCL 2 MG/2ML IJ SOLN
INTRAMUSCULAR | Status: AC
  Filled 2017-08-04: qty 4

## 2017-08-04 MED ORDER — ALBUMIN HUMAN 25 % IV SOLN
50.0000 g | Freq: Once | INTRAVENOUS | Status: AC
  Administered 2017-08-04: 50 g via INTRAVENOUS
  Filled 2017-08-04: qty 200

## 2017-08-04 NOTE — Discharge Summary (Signed)
Physician Discharge Summary  Shawn [name] MRN: [id number] DOB/AGE: 04/07/1961 56 y.o.  PCP: Patient, No Pcp Per   Admit date: 07/16/2017 Discharge date: 08/04/2017  Discharge Diagnoses:    Principal Problem:   Cirrhosis of liver with ascites ([place]) Active Problems:   Ascites due to alcoholic cirrhosis (HCC)   Thrombocytopenia (HCC)   Pleural effusion, left   SBP (spontaneous bacterial peritonitis) (HCC)   Hypoalbuminemia   Severe malnutrition (HCC)   AKI (acute kidney injury) ([place])   Protein-calorie malnutrition, severe   History of alcohol abuse   Polyclonal gammopathy   Palliative care by specialist   Goals of care, counseling/discussion   Encounter for hospice care discussion   Ascites of liver   SOB (shortness of breath)   Abdominal distention     Patient is being discharged home with hospice services      Current Discharge Medication List    START taking these medications   Details  ciprofloxacin (CIPRO) 500 MG tablet Take 1 tablet (500 mg total) by mouth daily. Qty: 30 tablet, Refills: 1    lactulose (CHRONULAC) 10 GM/15ML solution Take 15 mLs (10 g total) by mouth 3 (three) times daily. Qty: 240 mL, Refills: 2    polyethylene glycol (MIRALAX / GLYCOLAX) packet Take 17 g by mouth 2 (two) times daily. Qty: 14 each, Refills: 0    potassium chloride (K-DUR) 10 MEQ tablet Take 2 tablets (20 mEq total) by mouth daily. Qty: 60 tablet, Refills: 1    simethicone (MYLICON) 80 MG chewable tablet Chew 1 tablet (80 mg total) by mouth 4 (four) times daily as needed for flatulence. Qty: 30 tablet, Refills: 0    torsemide (DEMADEX) 100 MG tablet Take 1 tablet (100 mg total) by mouth daily. Qty: 30 tablet, Refills: 1      CONTINUE these medications which have NOT CHANGED   Details  Multiple Vitamins-Minerals (MULTIVITAMIN ADULTS 50+) TABS Take 1 tablet by mouth daily.         Discharge Condition: Prognosis guarded, patient is being discharged with hospice      Discharge Instructions    Diet - low sodium heart healthy    Complete by:  As directed    Increase activity slowly    Complete by:  As directed        No Known Allergies    Disposition: 01-Home or Self Care   Consults:  Interventional radiology Nephrology Surgery   Significant Diagnostic Studies:  Ct Abdomen Pelvis Wo Contrast  Result Date: 07/16/2017 CLINICAL DATA:  Abdominal distention, weakness.  Heavy drinker. EXAM: CT ABDOMEN AND PELVIS WITHOUT CONTRAST TECHNIQUE: Multidetector CT imaging of the abdomen and pelvis was performed following the standard protocol without IV contrast. COMPARISON:  None. FINDINGS: Lower chest: Moderate left and small right pleural effusion. Patchy right lower lobe opacity, likely atelectasis, pneumonia not excluded. Patchy left lower lobe opacity, likely atelectasis. Hepatobiliary: Shrunken, nodular liver, reflecting a cirrhotic configuration. No focal hepatic lesion is seen. Gallbladder is contracted with multiple small gallstones (series 2/image 28). Pancreas: Within normal limits. Spleen: Normal in size. Adrenals/Urinary Tract: Adrenal glands are within normal limits. Kidneys are within normal limits.  No renal or ureteral calculi. Bladder is not discretely visualized. Stomach/Bowel: Stomach is within normal limits. Visualized bowel is grossly unremarkable. No evidence of bowel obstruction. Vascular/Lymphatic: No evidence of abdominal aortic aneurysm. Mild atherosclerotic calcifications. No gross abdominopelvic lymphadenopathy, although poorly evaluated. Reproductive: Prostate is unremarkable. Other: Large volume abdominopelvic ascites. Musculoskeletal: Mild degenerative changes of the  visualized thoracolumbar spine, most prominent at L4-5. IMPRESSION: Cirrhosis.  No focal hepatic lesion is seen. Large volume abdominopelvic ascites. Moderate left and small right pleural effusions. Patchy bilateral lower lobe opacities, likely atelectasis, right lower  lobe pneumonia not excluded. Electronically Signed   By: Julian Hy M.D.   On: 07/16/2017 18:17   Dg Chest 2 View  Result Date: 07/27/2017 CLINICAL DATA:  Onset of shortness of breath today. History of hypertension, cirrhosis and ascites, acute renal injury EXAM: CHEST  2 VIEW COMPARISON:  Chest x-ray of July 26, 2017 FINDINGS: The lungs are reasonably well inflated. Small bilateral pleural effusions persist. There is no alveolar infiltrate or pleural effusion. The heart and pulmonary vascularity are normal. The mediastinum is normal in width. The bony thorax exhibits no acute abnormality. IMPRESSION: Persistent small bilateral pleural effusions. Otherwise no active cardiopulmonary disease. Electronically Signed   By: Jader  Martinique M.D.   On: 07/27/2017 16:01   Dg Chest 2 View  Result Date: 07/26/2017 CLINICAL DATA:  Lisette Grinder, paracentesis a day ago also 2 days ago EXAM: CHEST - 2 VIEW COMPARISON:  07/16/2017 FINDINGS: Improved aeration in the lung bases. Small residual pleural effusions. Heart size and mediastinal contours are within normal limits. No pneumothorax. Visualized bones unremarkable. IMPRESSION: Improved aeration.  Residual small pleural effusions. Electronically Signed   By: Lucrezia Europe M.D.   On: 07/26/2017 11:51   Dg Chest 2 View  Result Date: 07/16/2017 CLINICAL DATA:  Cough and weakness EXAM: CHEST  2 VIEW COMPARISON:  02/05/2015 FINDINGS: Cardiac shadow is stable. The overall inspiratory effort is poor with bibasilar atelectatic changes. Left pleural effusion is noted similar to that seen on recent chest CT. No other focal abnormality is noted. IMPRESSION: Bibasilar atelectasis and left-sided pleural effusion. Electronically Signed   By: Inez Catalina M.D.   On: 07/16/2017 18:15   US Paracentesis  Result Date: 07/31/2017 INDICATION: Alcoholic cirrhosis, recurrent ascites EXAM: ULTRASOUND GUIDED DIAGNOSTIC PARACENTESIS MEDICATIONS: None. COMPLICATIONS: None immediate.  PROCEDURE: Procedure, benefits, and risks of procedure were discussed with patient. Written informed consent for procedure was obtained. Time out protocol followed. Adequate collection of ascites localized by ultrasound in RIGHT lower quadrant. Skin prepped and draped in usual sterile fashion. Skin and soft tissues anesthetized with 10 mL of 1% lidocaine. 5 [place] Yueh catheter placed into peritoneal cavity. 2.2 L of ascitic fluid aspirated by vacuum bottle suction. Procedure tolerated well by patient without immediate complication. FINDINGS: As above IMPRESSION: Successful ultrasound-guided paracentesis yielding 2.2 liters of peritoneal fluid. Electronically Signed   By: Lavonia Dana M.D.   On: 07/31/2017 13:25   US Paracentesis  Result Date: 07/25/2017 INDICATION: Alcoholic cirrhosis, ascites EXAM: ULTRASOUND GUIDED THERAPEUTIC PARACENTESIS MEDICATIONS: None. COMPLICATIONS: None immediate. PROCEDURE: Procedure, benefits, and risks of procedure were discussed with patient. Written informed consent for procedure was obtained. Time out protocol followed. Adequate collection of ascites localized by ultrasound in LEFT lower quadrant. Skin prepped and draped in usual sterile fashion. Skin and soft tissues anesthetized with 10 mL of 1% lidocaine. 5 [place] Yueh catheter placed into peritoneal cavity. 6.1 L of yellow ascitic fluid aspirated by vacuum bottle suction. Patient reported onset of mild upper abdominal discomfort, the same feeling at associated with the previous paracentesis when he developed postprocedural hypotension ; at the patient's request, the procedure was terminated at this point. Procedure tolerated well by patient without immediate complication. FINDINGS: As above IMPRESSION: Successful ultrasound-guided paracentesis yielding 6.1 liters of peritoneal fluid. Electronically Signed   By: Elta Guadeloupe  Thornton Papas M.D.   On: 07/25/2017 15:58   US Paracentesis  Result Date: 07/21/2017 INDICATION: Alcoholic  cirrhosis, ascites EXAM: ULTRASOUND GUIDED  PARACENTESIS MEDICATIONS: None. COMPLICATIONS: None immediate. PROCEDURE: Procedure, benefits, and risks of procedure were discussed with patient. Written informed consent for procedure was obtained. Time out protocol followed. Adequate collection of ascites localized by ultrasound in RIGHT lower quadrant. Skin prepped and draped in usual sterile fashion. Skin and soft tissues anesthetized with 10 mL of 1% lidocaine. 5 [place] Yueh catheter placed into peritoneal cavity. 13.0 of clear yellow fluid aspirated by vacuum bottle suction. Procedure tolerated well by patient without immediate complication. FINDINGS: As above IMPRESSION: Successful ultrasound-guided paracentesis yielding 13 liters of peritoneal fluid. Electronically Signed   By: Lavonia Dana M.D.   On: 07/21/2017 14:55   US Paracentesis  Result Date: 07/19/2017 INDICATION: Symptomatic ascites with shortness of breath. Rapid reaccumulation after paracentesis yesterday. Request for additional drainage. Patient received IV albumin prior to the procedure. EXAM: ULTRASOUND GUIDED THERAPEUTIC PARACENTESIS MEDICATIONS: None. COMPLICATIONS: None immediate. PROCEDURE: Informed written consent was obtained from the patient after a discussion of the risks, benefits and alternatives to treatment. Patient was informed that he was at increased risk of bleeding due to liver dysfunction, elevated INR, and depressed platelets. He accepts this level of risk due to degree of symptoms. A timeout was performed prior to the initiation of the procedure. Initial ultrasound scanning demonstrates a massive amount of ascites within the left lower abdominal quadrant, with tense abdomen and shallow breathing. The left lower abdomen was prepped and draped in the usual sterile fashion. 1% lidocaine was used for local anesthesia. Following this, a 19 gauge, 7-cm, Yueh catheter was introduced. An ultrasound image was saved for documentation  purposes. The paracentesis was performed. Due to patient's bleeding risk for repeat punctures a larger volume of drainage than before was needed to allow time for medical stabilization of the patient's ascites. Although there was additional ascites available for withdrawal and the patient's blood pressure was stable, no additional drainage performed to ensure no adverse renal effects. The catheter was removed and a dressing was applied. The patient tolerated the procedure well without immediate post procedural complication. FINDINGS: A total of approximately 10 L of yellow ascitic fluid was removed. IMPRESSION: Successful ultrasound-guided paracentesis yielding 10 liters of peritoneal fluid. Electronically Signed   By: Monte Fantasia M.D.   On: 07/19/2017 17:36   US Paracentesis  Result Date: 07/17/2017 INDICATION: Ascites. EXAM: ULTRASOUND GUIDED RIGHT PARACENTESIS MEDICATIONS: None. COMPLICATIONS: None immediate. PROCEDURE: Informed written consent was obtained from the patient after a discussion of the risks, benefits and alternatives to treatment. A timeout was performed prior to the initiation of the procedure. Initial ultrasound scanning demonstrates a large amount of ascites within the right lower abdominal quadrant. The right lower abdomen was prepped and draped in the usual sterile fashion. 1% lidocaine with epinephrine was used for local anesthesia. Following this, a Yuen catheter was introduced. An ultrasound image was saved for documentation purposes. The paracentesis was performed. The catheter was removed and a dressing was applied. The patient tolerated the procedure well without immediate post procedural complication. FINDINGS: A total of approximately 4.2 L of straw-colored fluid was removed. Samples were sent to the laboratory as requested by the clinical team. IMPRESSION: Successful ultrasound-guided paracentesis yielding 4.2 liters of peritoneal fluid. Electronically Signed   By: Lorriane Shire M.D.   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 Current history of alcoholic cirrhosis an acute renal insufficiency. EXAM: METASTATIC BONE SURVEY COMPARISON:  Chest x-ray 07/16/2017. Bone window images from CT abdomen and pelvis 07/16/2017. Left foot x-rays 09/04/2004. FINDINGS: The following bones were imaged: Lateral skull:  No lytic lesions. AP views of both shoulders: No lytic lesions. Mild degenerative changes involving both acromioclavicular joints. AP views of both humeri:  No lytic lesions or endosteal scalloping. AP views of both forearms: No lytic lesions or endosteal scalloping. Cervical spine, AP and cross-table lateral views: Straightening of the usual cervical lordosis. Visualization only through the upper endplate of C6 on the lateral view. No lytic lesions. Thoracic spine, AP and cross-table lateral views: Cross-table lateral image is underexposed, though the AP image is diagnostic. No lytic lesions. Mild degenerative disc disease and spondylosis involving the mid and lower thoracic spine. Lumbar spine, AP and cross-table lateral views: 5 non-rib-bearing lumbar vertebrae with anatomic alignment. No lytic lesions. Severe disc space narrowing at L5-S1. Moderate disc space narrowing at L4-5. PA chest: No visible lytic lesions involving the ribs. Cardiac silhouette mildly enlarged, unchanged. Elevation of the right hemidiaphragm to shown on the recent CT to be secondary to a large amount of ascites. Small left pleural effusion with associated passive atelectasis in the left lower lobe, unchanged. Pulmonary venous hypertension without overt edema. AP pelvis: No lytic lesions. Well-preserved joint spaces in both hips. Sacroiliac joints and symphysis pubis intact. AP views of both femurs:  No lytic lesions or endosteal scalloping. Lateral views of both tibia-fibula: No lytic lesions or  endosteal scalloping. IMPRESSION: No evidence of multiple myeloma or other generalized bone disorder. Electronically Signed   By: Evangeline Dakin M.D.   On: 07/22/2017 15:42        Filed Weights   07/16/17 2112 07/20/17 0456 07/26/17 0629  Weight: 98.2 kg (216 lb 8 oz) 86.9 kg (191 lb 9.6 oz) 86.9 kg (191 lb 9.3 oz)     Microbiology: Recent Results (from the past 240 hour(s))  Gram stain     Status: None   Collection Time: 07/31/17 12:20 PM  Result Value Ref Range Status   Specimen Description ASCITIC  Final   Special Requests NONE  Final   Gram Stain   Final    CYTOSPIN SMEAR NO ORGANISMS SEEN WBC SEEN WBC PRESENT,BOTH PMN AND MONONUCLEAR    Report Status 07/31/2017 FINAL  Final  Culture, body fluid-bottle     Status: None (Preliminary result)   Collection Time: 07/31/17 12:20 PM  Result Value Ref Range Status   Specimen Description ASCITIC COLLECTED BY DOCTOR  Final   Special Requests BOTTLES DRAWN AEROBIC AND ANAEROBIC 10 CC EACH  Final   Culture NO GROWTH 4 DAYS  Final   Report Status PENDING  Incomplete       Blood Culture    Component Value Date/Time   SDES ASCITIC 07/31/2017 1220   SDES ASCITIC COLLECTED BY DOCTOR 07/31/2017 1220   SPECREQUEST NONE 07/31/2017 1220   SPECREQUEST BOTTLES DRAWN AEROBIC AND ANAEROBIC 10 CC EACH 07/31/2017 1220   CULT NO GROWTH 4 DAYS 07/31/2017 1220   REPTSTATUS 07/31/2017 FINAL 07/31/2017 1220   REPTSTATUS PENDING 07/31/2017 1220      Labs: Results for orders placed or performed during the hospital encounter of 07/16/17 (from the past 48 hour(s))  Glucose, capillary     Status: Abnormal   Collection Time: 08/02/17 11:53 AM  Result Value Ref Range   Glucose-Capillary 144 (  H) 65 - 99 mg/dL   Comment 1 Notify RN    Comment 2 Document in Chart   Glucose, capillary     Status: Abnormal   Collection Time: 08/02/17  4:34 PM  Result Value Ref Range   Glucose-Capillary 181 (H) 65 - 99 mg/dL  Glucose, capillary     Status:  Abnormal   Collection Time: 08/02/17  8:49 PM  Result Value Ref Range   Glucose-Capillary 118 (H) 65 - 99 mg/dL   Comment 1 Notify RN    Comment 2 Document in Chart   Glucose, capillary     Status: Abnormal   Collection Time: 08/03/17  8:00 AM  Result Value Ref Range   Glucose-Capillary 118 (H) 65 - 99 mg/dL  Glucose, capillary     Status: Abnormal   Collection Time: 08/03/17 11:37 AM  Result Value Ref Range   Glucose-Capillary 153 (H) 65 - 99 mg/dL  Protime-INR     Status: Abnormal   Collection Time: 08/03/17 12:58 PM  Result Value Ref Range   Prothrombin Time 21.7 (H) 11.4 - 15.2 seconds   INR 1.91   APTT     Status: Abnormal   Collection Time: 08/03/17 12:58 PM  Result Value Ref Range   aPTT 46 (H) 24 - 36 seconds    Comment:        IF BASELINE aPTT IS ELEVATED, SUGGEST PATIENT RISK ASSESSMENT BE USED TO DETERMINE APPROPRIATE ANTICOAGULANT THERAPY.   Glucose, capillary     Status: Abnormal   Collection Time: 08/03/17  4:44 PM  Result Value Ref Range   Glucose-Capillary 107 (H) 65 - 99 mg/dL  Glucose, capillary     Status: Abnormal   Collection Time: 08/03/17  9:02 PM  Result Value Ref Range   Glucose-Capillary 129 (H) 65 - 99 mg/dL   Comment 1 Notify RN    Comment 2 Document in Chart   CBC     Status: Abnormal   Collection Time: 08/04/17  6:48 AM  Result Value Ref Range   WBC 8.0 4.0 - 10.5 K/uL   RBC 2.32 (L) 4.22 - 5.81 MIL/uL   Hemoglobin 7.9 (L) 13.0 - 17.0 g/dL   HCT 22.8 (L) 39.0 - 52.0 %   MCV 98.3 78.0 - 100.0 fL   MCH 34.1 (H) 26.0 - 34.0 pg   MCHC 34.6 30.0 - 36.0 g/dL   RDW 18.0 (H) 11.5 - 15.5 %   Platelets 87 (L) 150 - 400 K/uL    Comment: CONSISTENT WITH PREVIOUS RESULT  Comprehensive metabolic panel     Status: Abnormal   Collection Time: 08/04/17  6:48 AM  Result Value Ref Range   Sodium 130 (L) 135 - 145 mmol/L   Potassium 3.7 3.5 - 5.1 mmol/L   Chloride 100 (L) 101 - 111 mmol/L   CO2 22 22 - 32 mmol/L   Glucose, Bld 122 (H) 65 - 99 mg/dL    BUN 94 (H) 6 - 20 mg/dL   Creatinine, Ser 3.11 (H) 0.61 - 1.24 mg/dL   Calcium 8.3 (L) 8.9 - 10.3 mg/dL   Total Protein 5.9 (L) 6.5 - 8.1 g/dL   Albumin 2.5 (L) 3.5 - 5.0 g/dL   AST 36 15 - 41 U/L   ALT 16 (L) 17 - 63 U/L   Alkaline Phosphatase 65 38 - 126 U/L   Total Bilirubin 1.5 (H) 0.3 - 1.2 mg/dL   GFR calc non Af Amer 21 (L) >60 mL/min   GFR  calc Af Amer 24 (L) >60 mL/min    Comment: (NOTE) The eGFR has been calculated using the CKD EPI equation. This calculation has not been validated in all clinical situations. eGFR's persistently <60 mL/min signify possible Chronic Kidney Disease.    Anion gap 8 5 - 15  Glucose, capillary     Status: Abnormal   Collection Time: 08/04/17  7:39 AM  Result Value Ref Range   Glucose-Capillary 121 (H) 65 - 99 mg/dL   Comment 1 Notify RN    Comment 2 Document in Chart        HPI    57yo M w/ a hx of alcohol abuse who presented with generalized weakness, abdominal distention. Admitted for massive ascites secondary to alcoholic cirrhosis. He was seen by gastroenterology in consultation. He was subsequently diagnosed with spontaneous bacterial peritonitis and treated with a full course of antibiotics and is now on suppressive therapy. Hospitalization has been complicated and prolonged by acute kidney injury which is slowly progressive and of unclear etiology. Further complicating matters is rapidly recurring ascites requiring multiple paracenteses  HOSPITAL COURSE:   Decompensated alcoholic cirrhosis with associated thrombocytopenia and massive volume overload - MELD 29 - stopped drinking alcohol four months prior to this admit  - s/p large volume paracentesis 8/27, 8/29, 8/31, 9/4, 9/10, now status post tunneled peritoneal drain catheter placement  9/14 by interventional radiology for recurrent ascites based on palliative care recommendations. He has another 8L drained  [facility] at  [facility], was unable to perform this procedure Interventional  radiology was finally contacted for placement, patient given albumin prior to the procedure patient  Discharged  home with hospice after procedure    Constipation / resolved Continue MiraLAX   Acute kidney injury 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 vs ATN/paraprothenmia  - not a candidate for HD - appears crt has peaked - cont to monitor trend , slowly improving. Creatinine 3.11 prior to discharge. 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 rate 18, height 5' 9"  (1.753 m), weight 86.9 kg (191 lb 9.3 oz), SpO2 99 %.  General: No acute respiratory distress - alert  Lungs: CTA B - no wheeze  Cardiovascular: Regular rate and rhythm  Abdomen: Nontender, distended,Fluid shift, soft, bowel sounds positive, no rebound - obvious ascites but not tense  Extremities: 2+ edema B LE     Follow-up Information    Fran Lowes, MD Follow up on 08/29/2017.   Specialty:  Nephrology Why:  Renal panel; before coming to the office at 1:00 pm Contact  information: [address] [place] [address] [phone]  Jani Gravel, MD. Call on 08/09/2017.   Specialty:  Internal Medicine Why:  at 8:00 am Contact information: [address] [place] [address] [phone]           Signed: Reyne Dumas 08/04/2017, 8:58 AM        Time spent >1 hour

## 2017-08-04 NOTE — Progress Notes (Signed)
Patient is to be discharged home and in stable condition. Patient given discharge instructions and verbalized understanding. Patient will be escorted out by staff via wheelchair upon arrival of transportation.  Quita Skye, RN

## 2017-08-04 NOTE — Sedation Documentation (Signed)
Pt awaiting procedure at desk. PA saw pt and consented procedure. VS stable

## 2017-08-04 NOTE — Sedation Documentation (Signed)
Patient is resting comfortably. 

## 2017-08-04 NOTE — Procedures (Signed)
Interventional Radiology Procedure Note  Procedure: Tunneled peritoneal PleurX catheter placement  Complications: None  Estimated Blood Loss: < 10 mL  15.5 Fr tunneled PleurX catheter placed in peritoneal cavity via right lower abdominal approach.  Paracentesis in process via catheter.  Shawn [name]. Fredia Sorrow, M.D Pager:  [phone]

## 2017-08-04 NOTE — Sedation Documentation (Signed)
Total 8L drained- fluid light orange color

## 2017-08-04 NOTE — Care Management Note (Signed)
Case Management Note  Patient Details  Name: Shawn [name] MRN: [id number] Date of Birth: April 29, 1961  Expected Discharge Date:  08/04/17               Expected Discharge Plan:  Home w Hospice Care  In-House Referral:  NA  Discharge planning Services  CM Consult  Post Acute Care Choice:  NA Choice offered to:  NA  DME Arranged:  Walker rolling, Hospital bed, Bedside commode, Overbed table DME Agency:    [facility]   HH Arranged:    RN, aid HH Agency:   [facility]  Status of Service:  Completed, signed off  Additional Comments: Pt discharging home today. He has had drain placed. CM has completed order for cont supplies and faxed and mailed to [facility]. Copy sent with pt. Pt has arranged his own transportation home. He plans to call [facility] once home so they may deliver DME. [facility] rep is aware of DC today, pt will be admitted tomorrow AM. CM will fax DC summary to Dr. Crista Curb at the [facility] (fax [phone])  Malcolm Metro, RN 08/04/2017, 2:11 PM

## 2017-08-04 NOTE — Sedation Documentation (Signed)
6L fluid drained from pleurex so far. BP 89/60. Will continue to drain and monitor

## 2017-08-05 LAB — CULTURE, BODY FLUID-BOTTLE: CULTURE: NO GROWTH

## 2017-08-05 LAB — CULTURE, BODY FLUID W GRAM STAIN -BOTTLE

## 2017-09-21 DEATH — deceased

## 2018-10-27 IMAGING — US US PARACENTESIS
1 series · 3 of 3 positions shown · non-contrast
Comparison: none

INDICATION: Symptomatic ascites with shortness of breath. Rapid reaccumulation
after paracentesis yesterday. Request for additional drainage.
Patient received IV albumin prior to the procedure.

[Series 1: us paracentesis · 0.25mm/px · 3 of 3 slices shown]
[im 1/3]
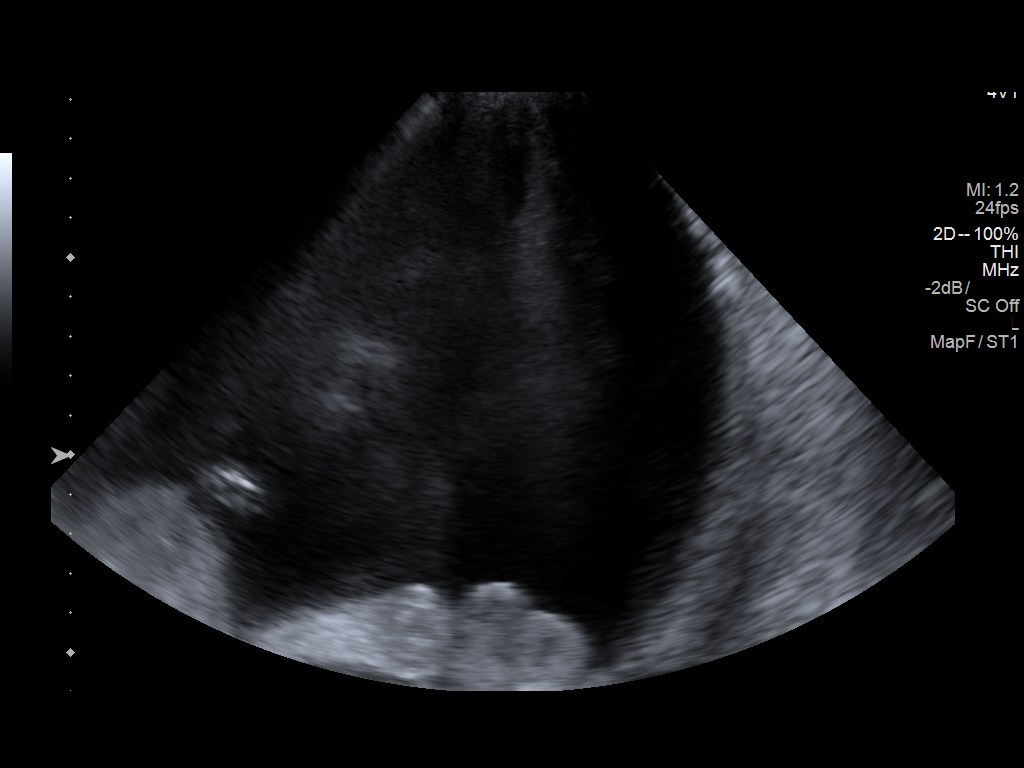
[im 2/3]
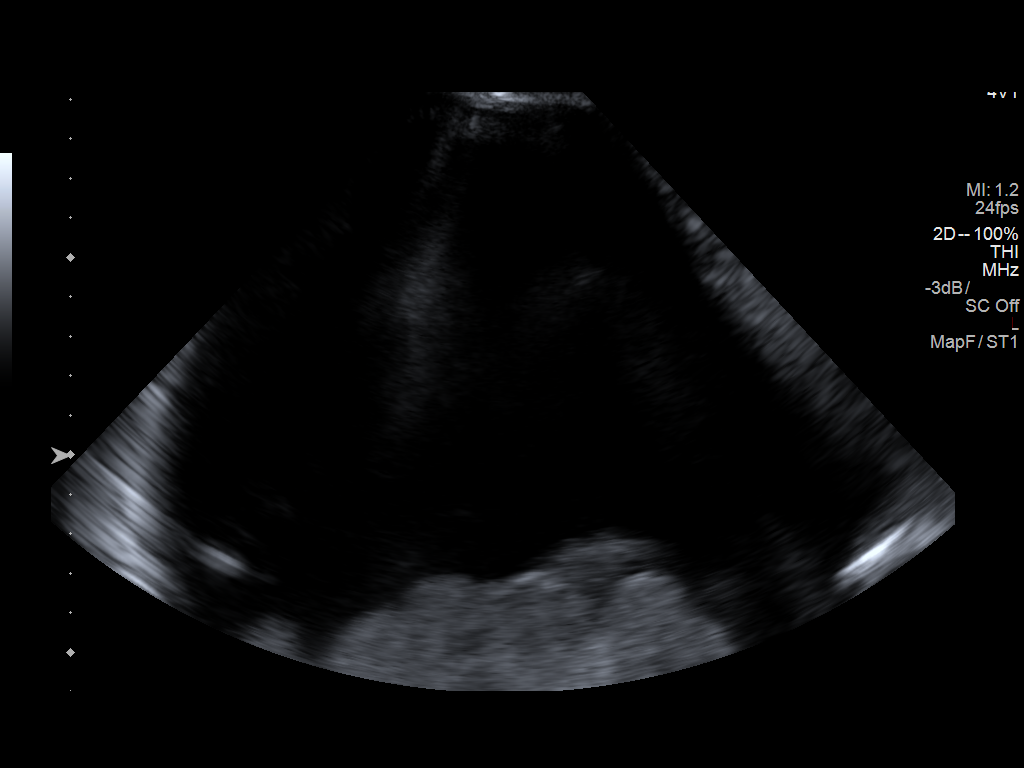
[im 3/3]
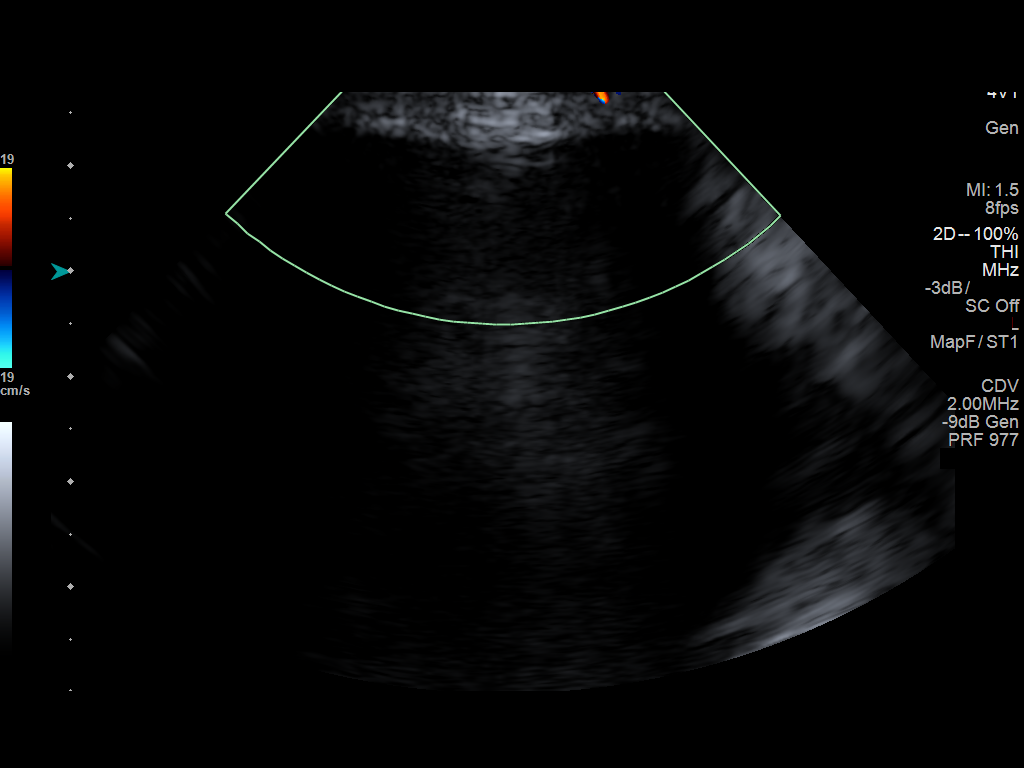

[3 of 3 positions shown; findings below may reference images not displayed]

EXAM:
ULTRASOUND GUIDED THERAPEUTIC PARACENTESIS

MEDICATIONS:
None.

COMPLICATIONS:
None immediate.

PROCEDURE:
Informed written consent was obtained from the patient after a
discussion of the risks, benefits and alternatives to treatment.
Patient was informed that he was at increased risk of bleeding due
to liver dysfunction, elevated INR, and depressed platelets. He
accepts this level of risk due to degree of symptoms. A timeout was
performed prior to the initiation of the procedure.

Initial ultrasound scanning demonstrates a massive amount of ascites
within the left lower abdominal quadrant, with tense abdomen and
shallow breathing. The left lower abdomen was prepped and draped in
the usual sterile fashion. 1% lidocaine was used for local
anesthesia.

Following this, a 19 gauge, 7-cm, Yueh catheter was introduced. An
ultrasound image was saved for documentation purposes. The
paracentesis was performed. Due to patient's bleeding risk for
repeat punctures a larger volume of drainage than before was needed
to allow time for medical stabilization of the patient's ascites.
Although there was additional ascites available for withdrawal and
the patient's blood pressure was stable, no additional drainage
performed to ensure no adverse renal effects. The catheter was
removed and a dressing was applied. The patient tolerated the
procedure well without immediate post procedural complication.
FINDINGS: A total of approximately 10 L of yellow ascitic fluid was removed.
IMPRESSION: Successful ultrasound-guided paracentesis yielding 10 liters of
peritoneal fluid.

## 2018-11-02 IMAGING — US US PARACENTESIS
1 series · 1 of 1 positions shown · non-contrast
Comparison: none

INDICATION: Alcoholic cirrhosis, ascites

[Series 1: us paracentesis · 0.21mm/px · 1 of 1 slices shown]
[im 1/1]
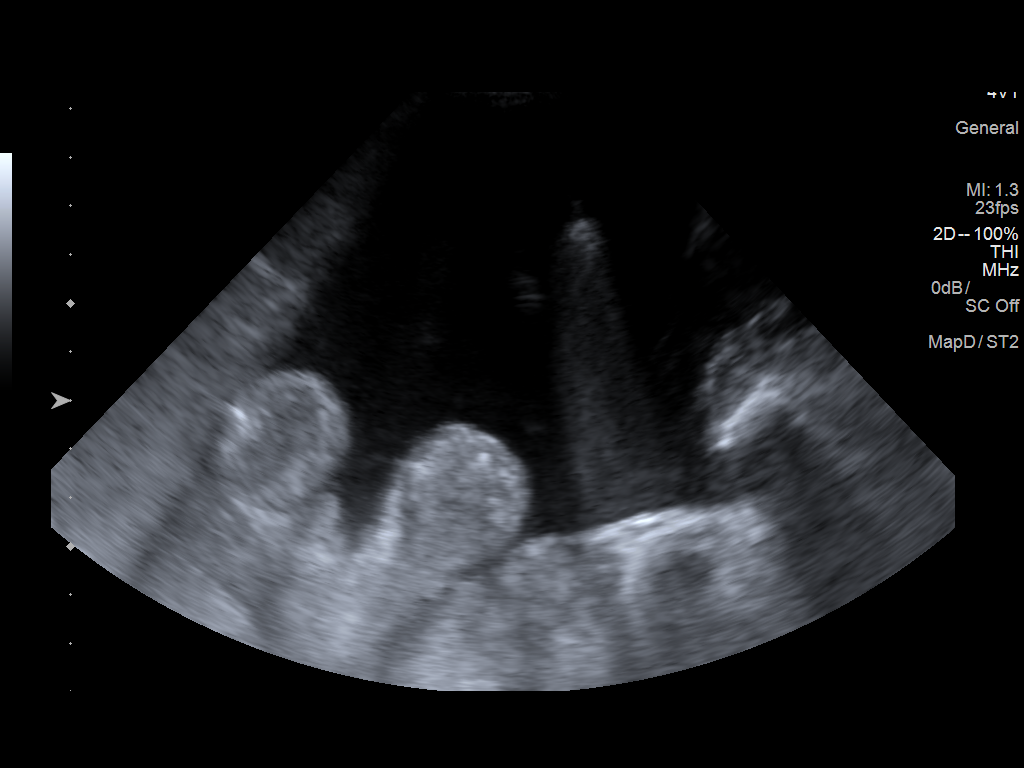

[1 of 1 positions shown; findings below may reference images not displayed]

EXAM:
ULTRASOUND GUIDED THERAPEUTIC PARACENTESIS

MEDICATIONS:
None.

COMPLICATIONS:
None immediate.

PROCEDURE:
Procedure, benefits, and risks of procedure were discussed with
patient.

Written informed consent for procedure was obtained.

Time out protocol followed.

Adequate collection of ascites localized by ultrasound in LEFT lower
quadrant.

Skin prepped and draped in usual sterile fashion.

Skin and soft tissues anesthetized with 10 mL of 1% lidocaine.

5 French Yueh catheter placed into peritoneal cavity.

6.1 L of yellow ascitic fluid aspirated by vacuum bottle suction.

Patient reported onset of mild upper abdominal discomfort, the same
feeling at associated with the previous paracentesis when he
developed postprocedural hypotension ; at the patient's request, the
procedure was terminated at this point.

Procedure tolerated well by patient without immediate complication.
FINDINGS: As above
IMPRESSION: Successful ultrasound-guided paracentesis yielding 6.1 liters of
peritoneal fluid.
